# Patient Record
Sex: Male | Born: 1950
Health system: Southern US, Community
[De-identification: ages and names within clinical notes are randomized; demographics above are authoritative.]

## PROBLEM LIST (undated history)

## (undated) DIAGNOSIS — N200 Calculus of kidney: Secondary | ICD-10-CM

## (undated) DIAGNOSIS — I2699 Other pulmonary embolism without acute cor pulmonale: Secondary | ICD-10-CM

## (undated) DIAGNOSIS — I10 Essential (primary) hypertension: Secondary | ICD-10-CM

## (undated) DIAGNOSIS — I712 Thoracic aortic aneurysm, without rupture: Secondary | ICD-10-CM

## (undated) DIAGNOSIS — M109 Gout, unspecified: Secondary | ICD-10-CM

## (undated) DIAGNOSIS — R002 Palpitations: Secondary | ICD-10-CM

## (undated) DIAGNOSIS — R0600 Dyspnea, unspecified: Secondary | ICD-10-CM

## (undated) DIAGNOSIS — R7989 Other specified abnormal findings of blood chemistry: Secondary | ICD-10-CM

## (undated) DIAGNOSIS — E7800 Pure hypercholesterolemia, unspecified: Secondary | ICD-10-CM

## (undated) HISTORY — DX: Dyspnea, unspecified: R06.00

## (undated) HISTORY — PX: OTHER SURGICAL HISTORY: SHX169

## (undated) HISTORY — DX: Other specified abnormal findings of blood chemistry: R79.89

## (undated) HISTORY — DX: Thoracic aortic aneurysm, without rupture: I71.2

## (undated) HISTORY — DX: Other pulmonary embolism without acute cor pulmonale: I26.99

## (undated) HISTORY — DX: Palpitations: R00.2

---

## 2001-04-23 ENCOUNTER — Ambulatory Visit (HOSPITAL_COMMUNITY): Admission: RE | Admit: 2001-04-23 | Discharge: 2001-04-23 | Payer: Self-pay | Admitting: Ophthalmology

## 2001-04-23 ENCOUNTER — Encounter: Payer: Self-pay | Admitting: Ophthalmology

## 2006-01-16 ENCOUNTER — Emergency Department (HOSPITAL_COMMUNITY): Admission: EM | Admit: 2006-01-16 | Discharge: 2006-01-16 | Payer: Self-pay | Admitting: Emergency Medicine

## 2006-01-19 ENCOUNTER — Encounter (HOSPITAL_COMMUNITY): Admission: RE | Admit: 2006-01-19 | Discharge: 2006-03-23 | Payer: Self-pay | Admitting: Emergency Medicine

## 2009-10-28 ENCOUNTER — Emergency Department (HOSPITAL_COMMUNITY): Admission: EM | Admit: 2009-10-28 | Discharge: 2009-10-28 | Payer: Self-pay | Admitting: Emergency Medicine

## 2010-09-28 LAB — DIFFERENTIAL
Eosinophils Absolute: 0.1 10*3/uL (ref 0.0–0.7)
Lymphs Abs: 2.2 10*3/uL (ref 0.7–4.0)
Monocytes Absolute: 1.7 10*3/uL — ABNORMAL HIGH (ref 0.1–1.0)
Neutrophils Relative %: 63 % (ref 43–77)

## 2010-09-28 LAB — CBC
HCT: 40.8 % (ref 39.0–52.0)
Hemoglobin: 13.9 g/dL (ref 13.0–17.0)
MCHC: 34 g/dL (ref 30.0–36.0)
MCV: 84.7 fL (ref 78.0–100.0)
Platelets: 233 10*3/uL (ref 150–400)
RBC: 4.81 MIL/uL (ref 4.22–5.81)
RDW: 12.8 % (ref 11.5–15.5)
WBC: 10.9 10*3/uL — ABNORMAL HIGH (ref 4.0–10.5)

## 2010-09-28 LAB — URINALYSIS, ROUTINE W REFLEX MICROSCOPIC
Bilirubin Urine: NEGATIVE
Glucose, UA: NEGATIVE mg/dL
Ketones, ur: 15 mg/dL — AB
Nitrite: NEGATIVE
Protein, ur: 30 mg/dL — AB
Specific Gravity, Urine: 1.024 (ref 1.005–1.030)
Urobilinogen, UA: 0.2 mg/dL (ref 0.0–1.0)
pH: 5.5 (ref 5.0–8.0)

## 2010-09-28 LAB — POCT I-STAT, CHEM 8
Glucose, Bld: 123 mg/dL — ABNORMAL HIGH (ref 70–99)
HCT: 42 % (ref 39.0–52.0)
Hemoglobin: 14.3 g/dL (ref 13.0–17.0)
Potassium: 3.6 mEq/L (ref 3.5–5.1)
Sodium: 137 mEq/L (ref 135–145)
TCO2: 24 mmol/L (ref 0–100)

## 2010-09-28 LAB — URINE MICROSCOPIC-ADD ON

## 2015-05-18 ENCOUNTER — Ambulatory Visit
Admission: RE | Admit: 2015-05-18 | Discharge: 2015-05-18 | Disposition: A | Discharge status: Home / Self Care | Payer: 59 | Source: Ambulatory Visit | Attending: Internal Medicine | Admitting: Internal Medicine

## 2015-05-18 ENCOUNTER — Other Ambulatory Visit: Payer: Self-pay | Admitting: Internal Medicine

## 2015-05-18 DIAGNOSIS — N451 Epididymitis: Secondary | ICD-10-CM

## 2015-05-18 DIAGNOSIS — N5082 Scrotal pain: Secondary | ICD-10-CM

## 2018-01-15 ENCOUNTER — Ambulatory Visit (INDEPENDENT_AMBULATORY_CARE_PROVIDER_SITE_OTHER): Payer: Medicare Other | Admitting: Orthopedic Surgery

## 2018-01-15 ENCOUNTER — Ambulatory Visit (INDEPENDENT_AMBULATORY_CARE_PROVIDER_SITE_OTHER): Payer: Medicare Other

## 2018-01-15 ENCOUNTER — Encounter (INDEPENDENT_AMBULATORY_CARE_PROVIDER_SITE_OTHER): Payer: Self-pay | Admitting: Orthopedic Surgery

## 2018-01-15 VITALS — Ht 69.0 in | Wt 255.0 lb

## 2018-01-15 DIAGNOSIS — M25562 Pain in left knee: Secondary | ICD-10-CM | POA: Diagnosis not present

## 2018-01-15 NOTE — Progress Notes (Signed)
Office Visit Note   Patient: Kenneth [name]           Date of Birth: 04-Nov-1950           MRN: [id number] Visit Date: 01/15/2018 Requested by: [name], MD [id number] [place], [place] [address] PCP: [name], MD  Subjective: Chief Complaint  Patient presents with  . Left Knee - Pain    HPI: Kenneth [name] is a 67 year old patient with left knee pain.  He is had pain in the left knee for 6 years.  He is unable to walk on a hard surface.  He cannot really do this for any length of time.  He wants to discuss options.  He describes a history of gout but his uric acid level is controlled on allopurinol.  Does describe weakness and giving way along with bilateral ankle pain.  He does have a history of hypertension but no diabetes.  BMI 37.7.  He does not work except doing yard work.  No history of trauma to the knee or ankles.  He also takes ibuprofen as needed.              ROS: All systems reviewed are negative as they relate to the chief complaint within the history of present illness.  Patient denies  fevers or chills.  Impression is bilateral knee arthritis much worse on the left than the right.  He has bumped bone-on-bone medial compartment arthritis in that left knee. Assessment & Plan: Visit Diagnoses:  1. Left knee pain, unspecified chronicity     Plan: Both ankles also have some swelling more on the left than the right.  I think this is more than likely related to his gout.  No real intervention required in the ankles at this time but the knee may require injection at times.  He wants to try weight loss first.  Also encouraged him to use stationary bike as a tool for leg strengthening without putting a lot of wear and tear on the knee.  When his knee acts up and flares up again he will come back in for an injection of cortisone.  Until then we will see him back as needed  Follow-Up Instructions: Return if symptoms worsen or fail to improve.   Orders:  Orders Placed This  Encounter  Procedures  . XR KNEE 3 VIEW LEFT   No orders of the defined types were placed in this encounter.     Procedures: No procedures performed   Clinical Data: No additional findings.  Objective: Vital Signs: Ht 5\' 9"  (1.753 m)   Wt 255 lb (115.7 kg)   BMI 37.66 kg/m   Physical Exam:   Constitutional: Patient appears well-developed HEENT:  Head: Normocephalic Eyes:EOM are normal Neck: Normal range of motion Cardiovascular: Normal rate Pulmonary/chest: Effort normal Neurologic: Patient is alert Skin: Skin is warm Psychiatric: Patient has normal mood and affect  Orthopedic exam demonstrates normal gait alignment.  Does have a little bit more varus on that left than the right knee.  Pedal pulses palpable.  Ortho Exam: Range of motion includes full extension bilaterally to about 120 of flexion bilaterally.  On the left knee collateral and cruciate ligaments are stable.  There is no effusion in either knee.  Extensor mechanism is intact bilaterally.  No groin pain with internal extra rotation of the leg.  Bilateral ankle exam demonstrates palpable intact nontender anterior to posterior to peroneal and Achilles tendons.  He has good symmetric range of motion  but some boggy synovitis in that left ankle compared to the right.  No tissue crepitus is present.  No restriction of ankle dorsiflexion plantarflexion inversion or eversion or subtalar motion bilaterally.  Specialty Comments:  No specialty comments available.  Imaging: Xr Knee 3 View Left  Result Date: 01/15/2018 AP lateral merchant left knee reviewed.  Severe medial compartment arthritis is present with bone-on-bone changes noted.  Lateral compartment arthritis also present but less severe.  Patellofemoral spurring is present with central location of the patella within the trochlear groove.  No fracture or dislocation is seen.  Varus alignment is present more on the left than the right    PMFS History: There  are no active problems to display for this patient.  History reviewed. No pertinent past medical history.  History reviewed. No pertinent family history.  History reviewed. No pertinent surgical history. Social History   Occupational History  . Not on file  Tobacco Use  . Smoking status: Not on file  Substance and Sexual Activity  . Alcohol use: Not on file  . Drug use: Not on file  . Sexual activity: Not on file

## 2018-09-03 ENCOUNTER — Other Ambulatory Visit: Payer: Self-pay

## 2018-09-03 ENCOUNTER — Observation Stay (HOSPITAL_COMMUNITY)
Admission: EM | Admit: 2018-09-03 | Discharge: 2018-09-05 | Disposition: A | Discharge status: Home / Self Care | Payer: Medicare Other | Attending: Internal Medicine | Admitting: Internal Medicine

## 2018-09-03 ENCOUNTER — Emergency Department (HOSPITAL_COMMUNITY): Payer: Medicare Other

## 2018-09-03 ENCOUNTER — Other Ambulatory Visit: Payer: Self-pay | Admitting: Internal Medicine

## 2018-09-03 ENCOUNTER — Ambulatory Visit (HOSPITAL_COMMUNITY)
Admission: EM | Admit: 2018-09-03 | Discharge: 2018-09-03 | Disposition: A | Discharge status: Home / Self Care | Payer: Self-pay

## 2018-09-03 ENCOUNTER — Encounter (HOSPITAL_COMMUNITY): Payer: Self-pay | Admitting: *Deleted

## 2018-09-03 DIAGNOSIS — I2699 Other pulmonary embolism without acute cor pulmonale: Secondary | ICD-10-CM | POA: Diagnosis not present

## 2018-09-03 DIAGNOSIS — Z951 Presence of aortocoronary bypass graft: Secondary | ICD-10-CM | POA: Diagnosis not present

## 2018-09-03 DIAGNOSIS — R778 Other specified abnormalities of plasma proteins: Secondary | ICD-10-CM

## 2018-09-03 DIAGNOSIS — Z6839 Body mass index (BMI) 39.0-39.9, adult: Secondary | ICD-10-CM | POA: Insufficient documentation

## 2018-09-03 DIAGNOSIS — I160 Hypertensive urgency: Secondary | ICD-10-CM | POA: Diagnosis present

## 2018-09-03 DIAGNOSIS — I2510 Atherosclerotic heart disease of native coronary artery without angina pectoris: Secondary | ICD-10-CM | POA: Insufficient documentation

## 2018-09-03 DIAGNOSIS — Z79899 Other long term (current) drug therapy: Secondary | ICD-10-CM | POA: Diagnosis not present

## 2018-09-03 DIAGNOSIS — I499 Cardiac arrhythmia, unspecified: Secondary | ICD-10-CM | POA: Insufficient documentation

## 2018-09-03 DIAGNOSIS — E785 Hyperlipidemia, unspecified: Secondary | ICD-10-CM | POA: Diagnosis not present

## 2018-09-03 DIAGNOSIS — Z85820 Personal history of malignant melanoma of skin: Secondary | ICD-10-CM | POA: Diagnosis not present

## 2018-09-03 DIAGNOSIS — R0600 Dyspnea, unspecified: Principal | ICD-10-CM | POA: Insufficient documentation

## 2018-09-03 DIAGNOSIS — I7121 Aneurysm of the ascending aorta, without rupture: Secondary | ICD-10-CM | POA: Diagnosis present

## 2018-09-03 DIAGNOSIS — R002 Palpitations: Secondary | ICD-10-CM

## 2018-09-03 DIAGNOSIS — M109 Gout, unspecified: Secondary | ICD-10-CM | POA: Insufficient documentation

## 2018-09-03 DIAGNOSIS — E669 Obesity, unspecified: Secondary | ICD-10-CM | POA: Diagnosis not present

## 2018-09-03 DIAGNOSIS — Z8249 Family history of ischemic heart disease and other diseases of the circulatory system: Secondary | ICD-10-CM | POA: Diagnosis not present

## 2018-09-03 DIAGNOSIS — E7800 Pure hypercholesterolemia, unspecified: Secondary | ICD-10-CM | POA: Diagnosis not present

## 2018-09-03 DIAGNOSIS — Z791 Long term (current) use of non-steroidal anti-inflammatories (NSAID): Secondary | ICD-10-CM | POA: Insufficient documentation

## 2018-09-03 DIAGNOSIS — R7989 Other specified abnormal findings of blood chemistry: Secondary | ICD-10-CM | POA: Insufficient documentation

## 2018-09-03 DIAGNOSIS — I10 Essential (primary) hypertension: Secondary | ICD-10-CM | POA: Diagnosis not present

## 2018-09-03 DIAGNOSIS — R011 Cardiac murmur, unspecified: Secondary | ICD-10-CM

## 2018-09-03 DIAGNOSIS — I712 Thoracic aortic aneurysm, without rupture: Secondary | ICD-10-CM | POA: Diagnosis not present

## 2018-09-03 HISTORY — DX: Gout, unspecified: M10.9

## 2018-09-03 HISTORY — DX: Essential (primary) hypertension: I10

## 2018-09-03 HISTORY — DX: Other specified abnormalities of plasma proteins: R77.8

## 2018-09-03 HISTORY — DX: Other pulmonary embolism without acute cor pulmonale: I26.99

## 2018-09-03 HISTORY — DX: Calculus of kidney: N20.0

## 2018-09-03 HISTORY — DX: Pure hypercholesterolemia, unspecified: E78.00

## 2018-09-03 HISTORY — DX: Dyspnea, unspecified: R06.00

## 2018-09-03 HISTORY — DX: Other specified abnormal findings of blood chemistry: R79.89

## 2018-09-03 LAB — CBC WITH DIFFERENTIAL/PLATELET
ABS IMMATURE GRANULOCYTES: 0.04 10*3/uL (ref 0.00–0.07)
BASOS PCT: 1 %
Basophils Absolute: 0.1 10*3/uL (ref 0.0–0.1)
Eosinophils Absolute: 0.1 10*3/uL (ref 0.0–0.5)
Eosinophils Relative: 1 %
HCT: 43.3 % (ref 39.0–52.0)
Hemoglobin: 13.9 g/dL (ref 13.0–17.0)
Immature Granulocytes: 1 %
Lymphocytes Relative: 20 %
Lymphs Abs: 1.7 10*3/uL (ref 0.7–4.0)
MCH: 27 pg (ref 26.0–34.0)
MCHC: 32.1 g/dL (ref 30.0–36.0)
MCV: 84.2 fL (ref 80.0–100.0)
MONO ABS: 1.1 10*3/uL — AB (ref 0.1–1.0)
Monocytes Relative: 13 %
NEUTROS ABS: 5.4 10*3/uL (ref 1.7–7.7)
NEUTROS PCT: 64 %
NRBC: 0 % (ref 0.0–0.2)
PLATELETS: 220 10*3/uL (ref 150–400)
RBC: 5.14 MIL/uL (ref 4.22–5.81)
RDW: 12.7 % (ref 11.5–15.5)
WBC: 8.3 10*3/uL (ref 4.0–10.5)

## 2018-09-03 LAB — I-STAT TROPONIN, ED: Troponin i, poc: 0.09 ng/mL (ref 0.00–0.08)

## 2018-09-03 LAB — COMPREHENSIVE METABOLIC PANEL
ALT: 30 U/L (ref 0–44)
AST: 35 U/L (ref 15–41)
Albumin: 4.1 g/dL (ref 3.5–5.0)
Alkaline Phosphatase: 49 U/L (ref 38–126)
Anion gap: 10 (ref 5–15)
BUN: 10 mg/dL (ref 8–23)
CALCIUM: 9.1 mg/dL (ref 8.9–10.3)
CHLORIDE: 104 mmol/L (ref 98–111)
CO2: 24 mmol/L (ref 22–32)
CREATININE: 0.98 mg/dL (ref 0.61–1.24)
GFR calc non Af Amer: >60 mL/min (ref >60)
GLUCOSE: 97 mg/dL (ref 70–99)
Potassium: 3.9 mmol/L (ref 3.5–5.1)
SODIUM: 138 mmol/L (ref 135–145)
Total Bilirubin: 0.7 mg/dL (ref 0.3–1.2)
Total Protein: 7.1 g/dL (ref 6.5–8.1)

## 2018-09-03 LAB — URINALYSIS, ROUTINE W REFLEX MICROSCOPIC
BACTERIA UA: NONE SEEN
Bilirubin Urine: NEGATIVE
Glucose, UA: NEGATIVE mg/dL
Ketones, ur: NEGATIVE mg/dL
Leukocytes,Ua: NEGATIVE
Nitrite: NEGATIVE
Protein, ur: NEGATIVE mg/dL
Specific Gravity, Urine: 1.004 — ABNORMAL LOW (ref 1.005–1.030)
pH: 8 (ref 5.0–8.0)

## 2018-09-03 LAB — TROPONIN I
TROPONIN I: 0.12 ng/mL — AB (ref <0.03)
Troponin I: 0.12 ng/mL (ref <0.03)
Troponin I: 0.11 ng/mL (ref <0.03)

## 2018-09-03 LAB — D-DIMER, QUANTITATIVE: D-Dimer, Quant: 6.59 ug/mL-FEU — ABNORMAL HIGH (ref 0.00–0.50)

## 2018-09-03 LAB — BRAIN NATRIURETIC PEPTIDE: B Natriuretic Peptide: 119.4 pg/mL — ABNORMAL HIGH (ref 0.0–100.0)

## 2018-09-03 MED ORDER — ONDANSETRON HCL 4 MG PO TABS: 4.0000 mg | ORAL_TABLET | Freq: Four times a day (QID) | ORAL | Status: DC | PRN

## 2018-09-03 MED ORDER — FINASTERIDE 5 MG PO TABS
5.0000 mg | ORAL_TABLET | Freq: Every day | ORAL | Status: DC
  Administered 2018-09-04 – 2018-09-05 (×2): 5 mg via ORAL
  Filled 2018-09-03 (×2): qty 1

## 2018-09-03 MED ORDER — PRAVASTATIN SODIUM 10 MG PO TABS: 20.0000 mg | ORAL_TABLET | Freq: Every day | ORAL | Status: DC

## 2018-09-03 MED ORDER — LOSARTAN POTASSIUM 50 MG PO TABS
100.0000 mg | ORAL_TABLET | Freq: Every day | ORAL | Status: DC
  Administered 2018-09-04 – 2018-09-05 (×2): 100 mg via ORAL
  Filled 2018-09-03 (×2): qty 2

## 2018-09-03 MED ORDER — ALLOPURINOL 100 MG PO TABS
200.0000 mg | ORAL_TABLET | Freq: Every day | ORAL | Status: DC
  Administered 2018-09-04 – 2018-09-05 (×2): 200 mg via ORAL
  Filled 2018-09-03 (×2): qty 2

## 2018-09-03 MED ORDER — SODIUM CHLORIDE 0.9 % IV SOLN
INTRAVENOUS | Status: AC
  Administered 2018-09-03: 22:00:00 via INTRAVENOUS

## 2018-09-03 MED ORDER — HYDROCODONE-ACETAMINOPHEN 5-325 MG PO TABS: 1.0000 | ORAL_TABLET | ORAL | Status: DC | PRN

## 2018-09-03 MED ORDER — HYDRALAZINE HCL 20 MG/ML IJ SOLN: 10.0000 mg | Freq: Once | INTRAMUSCULAR | Status: DC

## 2018-09-03 MED ORDER — ACETAMINOPHEN 325 MG PO TABS: 650.0000 mg | ORAL_TABLET | Freq: Four times a day (QID) | ORAL | Status: DC | PRN

## 2018-09-03 MED ORDER — ENOXAPARIN SODIUM 40 MG/0.4ML ~~LOC~~ SOLN
40.0000 mg | SUBCUTANEOUS | Status: DC
  Administered 2018-09-03: 40 mg via SUBCUTANEOUS
  Filled 2018-09-03: qty 0.4

## 2018-09-03 MED ORDER — ALBUTEROL SULFATE (2.5 MG/3ML) 0.083% IN NEBU: 5.0000 mg | INHALATION_SOLUTION | Freq: Once | RESPIRATORY_TRACT | Status: DC

## 2018-09-03 MED ORDER — VERAPAMIL HCL 80 MG PO TABS
160.0000 mg | ORAL_TABLET | Freq: Every day | ORAL | Status: DC
  Administered 2018-09-04: 160 mg via ORAL
  Filled 2018-09-03: qty 2

## 2018-09-03 MED ORDER — ASPIRIN 325 MG PO TABS
325.0000 mg | ORAL_TABLET | Freq: Once | ORAL | Status: AC
Start: 2018-09-03 — End: 2018-09-03
  Administered 2018-09-03: 325 mg via ORAL
  Filled 2018-09-03: qty 1

## 2018-09-03 MED ORDER — CLONIDINE HCL 0.3 MG PO TABS
0.3000 mg | ORAL_TABLET | Freq: Every day | ORAL | Status: DC
  Administered 2018-09-04: 0.3 mg via ORAL
  Filled 2018-09-03: qty 1

## 2018-09-03 MED ORDER — ONDANSETRON HCL 4 MG/2ML IJ SOLN: 4.0000 mg | Freq: Four times a day (QID) | INTRAMUSCULAR | Status: DC | PRN

## 2018-09-03 MED ORDER — ACETAMINOPHEN 650 MG RE SUPP: 650.0000 mg | Freq: Four times a day (QID) | RECTAL | Status: DC | PRN

## 2018-09-03 MED ORDER — CLONIDINE HCL 0.1 MG PO TABS
0.1000 mg | ORAL_TABLET | Freq: Once | ORAL | Status: AC
  Administered 2018-09-03: 0.1 mg via ORAL
  Filled 2018-09-03: qty 1

## 2018-09-03 NOTE — ED Notes (Signed)
RN informed that it was ok for visitor to come back

## 2018-09-03 NOTE — ED Notes (Signed)
Attempted report x1. 

## 2018-09-03 NOTE — H&P (Addendum)
[name] IPJ:[id number] DOB: September 06, 1950 DOA: 09/03/2018     PCP: Kenneth Panda, MD   Outpatient Specialists: NONE   Patient arrived to ER on 09/03/18 at 1347  Patient coming from: home Lives alone,     Chief Complaint:  Chief Complaint  Patient presents with  . Shortness of Breath    HPI: Kenneth [name] is a 68 y.o. male with medical history significant of history of irregular heart beat, HTN and HLD, GOUT, melanoma 20 y ago    Presented with less of breath with exertion for past 4 days somewhat better with rest lower extremity swelling, he has hard time walking even 50 ft with out getting very short of breath. No pain with deep breathing Last time seen by cardiology yesterday he has been having some twinges in his left chest his family is concerned because of a similar to prior episode prior to having a CABG.  Has been feeling weak overall together. Been having chest pressure-like for the past 2 weeks. He was referred to go get an echogram as an outpatient but did not get it yet.  For the past few days he has been having much more shortness of breath with exertion.  No associated fevers or chills or cough no recent travel  Regarding pertinent Chronic problems:   Hypertension on losartan and the clonidine verapamil Reports occasional palpitation had an episode 2 wks ago when he had palpitations for a whole week was scheduled to had echo done  While in ER: To be hypertensive with blood pressures in 190s felt to be likely ACS versus hypertensive urgency his troponin was noted to be elevated  cardiology was consulted this was most likely secondary to demand ischemia recommend no heparin control blood pressure and admit to hospitalist  The following Work up has been ordered so far:  Orders Placed This Encounter  Procedures  . DG Chest 2 View  . CBC with Differential/Platelet  . Comprehensive metabolic panel  . Brain natriuretic peptide  . Troponin I - Add-On to  previous collection  . If O2 Sat <94% administer O2 at 2 liters/minute via nasal cannula  . Inpatient consult to Cardiology  . Consult to hospitalist  . Pulse oximetry, continuous  . I-stat troponin, ED  . EKG 12-Lead  . ED EKG  . Saline lock IV    Following Medications were ordered in ER: Medications  cloNIDine (CATAPRES) tablet 0.1 mg (0.1 mg Oral Given 09/03/18 1746)  aspirin tablet 325 mg (325 mg Oral Given 09/03/18 1746)    Significant initial  Findings: Abnormal Labs Reviewed  CBC WITH DIFFERENTIAL/PLATELET - Abnormal; Notable for the following components:      Result Value   Monocytes Absolute 1.1 (*)    All other components within normal limits  BRAIN NATRIURETIC PEPTIDE - Abnormal; Notable for the following components:   B Natriuretic Peptide 119.4 (*)    All other components within normal limits  TROPONIN I - Abnormal; Notable for the following components:   Troponin I 0.12 (*)    All other components within normal limits  I-STAT TROPONIN, ED - Abnormal; Notable for the following components:   Troponin i, poc 0.09 (*)    All other components within normal limits     Lactic Acid, Venous No results found for: LATICACIDVEN  Na 138 K 3.9  Cr   stable,   Lab Results  Component Value Date   CREATININE 0.98 09/03/2018   CREATININE 1.5 10/28/2009  WBC  8.3   HG/HCT  Stable     Component Value Date/Time   HGB 13.9 09/03/2018 1641   HCT 43.3 09/03/2018 1641     Troponin (Point of Care Test) Recent Labs    09/03/18 1652  TROPIPOC 0.09*     BNP (last 3 results) Recent Labs    09/03/18 1641  BNP 119.4*    ProBNP (last 3 results) No results for input(s): PROBNP in the last 8760 hours.    UA   ordered   CXR - NON acute   ECG:  Personally reviewed by me showing: HR : 82 Rhythm:  NSR,   no evidence of ischemic changes QTC 436      ED Triage Vitals  Enc Vitals Group     BP 09/03/18 1354 (!) 168/84     Pulse Rate 09/03/18 1354 85      Resp 09/03/18 1354 17     Temp 09/03/18 1354 98.3 F (36.8 C)     Temp Source 09/03/18 1354 Oral     SpO2 09/03/18 1354 98 %     Weight 09/03/18 1641 265 lb (120.2 kg)     Height 09/03/18 1641 5\' 9"  (1.753 m)     Head Circumference --      Peak Flow --      Pain Score 09/03/18 1643 0     Pain Loc --      Pain Edu? --      Excl. in [place]? --   TMAX(24)@       Latest  Blood pressure (!) 169/88, pulse 70, temperature 98.3 F (36.8 C), temperature source Oral, resp. rate 16, height 5\' 9"  (1.753 m), weight 120.2 kg, SpO2 98 %.    ER Provider Called:  Cardiology    Dr.Turner They Recommend admit to medicine  Re consult if abnormal work up   Hospitalist was called for admission for hypertensive urgency   Review of Systems:    Pertinent positives include: shortness of breath at rest. dyspnea on exertion  Constitutional:  No weight loss, night sweats, Fevers, chills, fatigue, weight loss  HEENT:  No headaches, Difficulty swallowing,Tooth/dental problems,Sore throat,  No sneezing, itching, ear ache, nasal congestion, post nasal drip,  Cardio-vascular:  No chest pain, Orthopnea, PND, anasarca, dizziness, palpitations.no Bilateral lower extremity swelling  GI:  No heartburn, indigestion, abdominal pain, nausea, vomiting, diarrhea, change in bowel habits, loss of appetite, melena, blood in stool, hematemesis Resp:  no , No excess mucus, no productive cough, No non-productive cough, No coughing up of blood.No change in color of mucus.No wheezing. Skin:  no rash or lesions. No jaundice GU:  no dysuria, change in color of urine, no urgency or frequency. No straining to urinate.  No flank pain.  Musculoskeletal:  No joint pain or no joint swelling. No decreased range of motion. No back pain.  Psych:  No change in mood or affect. No depression or anxiety. No memory loss.  Neuro: no localizing neurological complaints, no tingling, no weakness, no double vision, no gait abnormality, no  slurred speech, no confusion  All systems reviewed and apart from [place] all are negative  Past Medical History:   Past Medical History:  Diagnosis Date  . Gout   . Hypercholesteremia   . Hypertension       History reviewed. No pertinent surgical history.  Social History:  Ambulatory  Independently      reports that he has never smoked. He has never used smokeless tobacco. He  reports that he does not drink alcohol or use drugs.     Family History:   Family History  Problem Relation Age of Onset  . Diabetes Mother   . Hypertension Mother   . CAD Brother     Allergies: No Known Allergies   Prior to Admission medications   Medication Sig Start Date End Date Taking? Authorizing Provider  allopurinol (ZYLOPRIM) 100 MG tablet Take 200 mg by mouth daily.  10/27/17  Yes [provider]  calcium carbonate (TUMS - DOSED IN MG ELEMENTAL CALCIUM) 500 MG chewable tablet Chew 1 tablet by mouth at bedtime as needed for indigestion or heartburn.   Yes [provider]  cloNIDine (CATAPRES) 0.3 MG tablet Take 0.3 mg by mouth daily. 08/11/18  Yes [provider]  finasteride (PROSCAR) 5 MG tablet Take 5 mg by mouth daily. 11/30/17  Yes [provider]  ibuprofen (ADVIL,MOTRIN) 200 MG tablet Take 200 mg by mouth every 6 (six) hours as needed for moderate pain.   Yes [provider]  losartan (COZAAR) 100 MG tablet Take 100 mg by mouth daily. 12/20/17  Yes [provider]  lovastatin (MEVACOR) 20 MG tablet Take 20 mg by mouth daily. 12/26/17  Yes [provider]  verapamil (CALAN) 80 MG tablet Take 160 mg by mouth daily. 11/30/17  Yes [provider]   Physical Exam: Blood pressure (!) 169/88, pulse 70, temperature 98.3 F (36.8 C), temperature source Oral, resp. rate 16, height 5\' 9"  (1.753 m), weight 120.2 kg, SpO2 98 %. 1. General:  in No Acute distress   well -appearing 2. Psychological: Alert and  Oriented 3.  Head/ENT:    Dry Mucous Membranes                          Head Non traumatic, neck supple                           Poor Dentition 4. SKIN:   decreased Skin turgor,  Skin clean Dry and intact no rash 5. Heart: Regular rate and rhythm no  Murmur, no Rub or gallop 6. Lungs:  Clear to auscultation bilaterally, no wheezes or crackles   7. Abdomen: Soft,   Non distended  Obese bowel sounds present 8. Lower extremities: no clubbing, cyanosis, trace  edema 9. Neurologically Grossly intact, moving all 4 extremities equally  10. MSK: Normal range of motion   LABS:     Recent Labs  Lab 09/03/18 1641  WBC 8.3  NEUTROABS 5.4  HGB 13.9  HCT 43.3  MCV 84.2  PLT 962   Basic Metabolic Panel: Recent Labs  Lab 09/03/18 1641  NA 138  K 3.9  CL 104  CO2 24  GLUCOSE 97  BUN 10  CREATININE 0.98  CALCIUM 9.1      Recent Labs  Lab 09/03/18 1641  AST 35  ALT 30  ALKPHOS 49  BILITOT 0.7  PROT 7.1  ALBUMIN 4.1   No results for input(s): LIPASE, AMYLASE in the last 168 hours. No results for input(s): AMMONIA in the last 168 hours.    HbA1C: No results for input(s): HGBA1C in the last 72 hours. CBG: No results for input(s): GLUCAP in the last 168 hours.    Urine analysis:    Component Value Date/Time   COLORURINE YELLOW 10/28/2009 0511   APPEARANCEUR CLOUDY (A) 10/28/2009 0511   LABSPEC 1.024 10/28/2009 8366  PHURINE 5.5 10/28/2009 0511   GLUCOSEU NEGATIVE 10/28/2009 0511   HGBUR LARGE (A) 10/28/2009 0511   BILIRUBINUR NEGATIVE 10/28/2009 0511   KETONESUR 15 (A) 10/28/2009 0511   PROTEINUR 30 (A) 10/28/2009 0511   UROBILINOGEN 0.2 10/28/2009 0511   NITRITE NEGATIVE 10/28/2009 0511   LEUKOCYTESUR MODERATE (A) 10/28/2009 0511    Cultures: No results found for: SDES, SPECREQUEST, CULT, REPTSTATUS   Radiological Exams on Admission: Dg Chest 2 View  Result Date: 09/03/2018 CLINICAL DATA:  Shortness of breath 2 weeks with minimal exertion. Mild chest pain. EXAM: CHEST  - 2 VIEW COMPARISON:  None. FINDINGS: Lungs are adequately inflated without consolidation or effusion. Cardiomediastinal silhouette is within normal. There are mild degenerative changes of the spine. IMPRESSION: No active cardiopulmonary disease. Electronically Signed   By: Marin Olp M.D.   On: 09/03/2018 15:32    Chart has been reviewed    Assessment/Plan  68 y.o. male with medical history significant of history of irregular heart beat, HTN and HLD, GOUT, melanoma 20 y ago     Admitted for dyspnea found to have PE  Present on Admission: . Hypertensive urgency - BP currently stable restart home medications if no evidence of  severe PE . Elevated troponin -in the setting of dyspnea severe.  Cardiac etiology is a possibility cardiology is been consulted thought it may be secondary to hypertensive urgency given somewhat elevated blood pressures.  We will continue to cycle cardiac enzymes given recurrent history of palpitations sudden onset of dyspnea to severity where he could not ambulate without severe shortness of breath will need further work-up. Given elevated d-dimer will check CTA chest to rule out PE also be helpful to evaluate for severe cardiac calcification   . Dyspnea -check for PE.  Obtain echogram.  Patient with remote history of melanoma and positive d-dimer Palpitations intermittent will monitor on telemetry given episodes every few weeks or so would probably benefit from further cardiac work-up and event monitor as an outpatient  Pulmonary embolism CT showed PE.  Will order Dopplers lower extremity echogram and initiate heparin.  Elevated troponin and BNP somewhat worrisome but no evidence of right heart strain on CT and no evidence of severe large blood clot burden.  Discussed at length with patient.  Patient states he is very sedentary in the winter but has history of melanoma in the past and would benefit from further malignancy work-up  Aortic aneurysm-will need better  blood pressure control at the time of discharge once stable will need to have regular follow-up with vascular surgeon repeat imaging to monitor  Coronary artery calcification -with family history of significant coronary artery disease in his brother patient is still as high possibility of concomitant coronary artery disease appreciate cardiology input pain echogram to evaluate for wall motion abnormality  Other plan as per orders.  DVT prophylaxis:    Lovenox     Code Status:  FULL CODE  as per patient  I had personally discussed CODE STATUS with patient    Family Communication:   Family not  at  Bedside  plan of care was discussed with   Brother     Disposition Plan:   To home once workup is complete and patient is stable                      Consults called:  email cardiology    Admission status:    Obs    Level of care  tele  For 12H        Adelise Buswell 09/03/2018, 8:27 PM    [facility]     after 2 AM please page floor coverage PA If 7AM-7PM, please contact the day team taking care of the patient using [email or web address]

## 2018-09-03 NOTE — ED Notes (Signed)
Patient reports feeling weak.  Denies any chest pain.  Patient says he has had an irregular heart rhythm, and difficulty catching his breath.  Patient is alert and oriented x 3 , appears anxious.  Speaking in complete sentences. Patient's brother is with patient and frequently mentions he has had bypass surgery and concerned patient is exhibiting similar symptoms.  Patient has seen pcp last week for the same and reports normal blood work and ekg, no further follow up. Left radial pulse is regular and apical pulse is regular, A=R.  Breath sounds decreased, but no wheezing.    Spoke to dr Meda Coffee about patient.  Patient and brother are agreeable to going to ED now.

## 2018-09-03 NOTE — ED Triage Notes (Addendum)
Pt endorses sob with exertion x 4 days, alleviated with rest.  Reports bila LE edema which is not new.  Hx of afib, he went to see his cardiologist yesterday.  Rhythm was NSR.  He also reports feeling flushed, pt's face appears red.  Pt reports dizziness and mild "twinge" in his L chest to mid-chest.  He states the "twinge" in his chest only happened x 1 last week. He denies any at this time.,  He is A&Ox 4, in NAD.

## 2018-09-03 NOTE — ED Notes (Signed)
Dr. Darl Householder notified of elevated I-stat trop of 0.09

## 2018-09-03 NOTE — ED Provider Notes (Signed)
[place] EMERGENCY DEPARTMENT Provider Note   CSN: [id number] Arrival date & time: 09/03/18  1347    History   Chief Complaint Chief Complaint  Patient presents with  . Shortness of Breath    HPI Kenneth [name] is a 68 y.o. male hx of gout, HL, HTN, here presenting with shortness of breath, chest pressure.  Patient states that he has intermittent chest pressure over the last 2 weeks or so.  Patient states that the shortness of breath is worse with exertion.  He also has progressive bilateral leg swelling as well.  Patient saw his primary care doctor a week ago and had a annual physical with normal CBC, CMP.  Patient is referred to get an echo outpatient to further evaluate but did not get it yet.  Patient states that over the last several days he has much worse shortness of breath even with minimal exertion.  He denies any fevers or chills or cough.  Denies any recent travel or history of blood clots.  His brother has cardiac disease but denies any personal history of CAD or stents.  Patient states that he never had a stress test or echo.     The history is provided by the patient.    Past Medical History:  Diagnosis Date  . Gout   . Hypercholesteremia   . Hypertension     There are no active problems to display for this patient.   History reviewed. No pertinent surgical history.      Home Medications    Prior to Admission medications   Medication Sig Start Date End Date Taking? Authorizing Provider  allopurinol (ZYLOPRIM) 100 MG tablet  10/27/17   [provider]  cloNIDine (CATAPRES) 0.1 MG tablet TAKE 2 TABLETS BY MOUTH ONCE DAILY AT BEDTIME 11/30/17   [provider]  finasteride (PROSCAR) 5 MG tablet Take 5 mg by mouth daily. 11/30/17   [provider]  losartan (COZAAR) 100 MG tablet Take 100 mg by mouth daily. 12/20/17   [provider]  lovastatin (MEVACOR) 20 MG tablet Take 20 mg by mouth daily. 12/26/17    [provider]  verapamil (CALAN) 80 MG tablet Take 160 mg by mouth daily. 11/30/17   [provider]    Family History No family history on file.  Social History Social History   Tobacco Use  . Smoking status: Never Smoker  . Smokeless tobacco: Never Used  Substance Use Topics  . Alcohol use: Never    Frequency: Never  . Drug use: Never     Allergies   Patient has no known allergies.   Review of Systems Review of Systems  Respiratory: Positive for shortness of breath.   All other systems reviewed and are negative.    Physical Exam Updated Vital Signs BP (!) 187/92 (BP Location: Right Arm)   Pulse 79   Temp 98.3 F (36.8 C) (Oral)   Resp 13   Ht 5\' 9"  (1.753 m)   Wt 120.2 kg   SpO2 99%   BMI 39.13 kg/m   Physical Exam Vitals signs and nursing note reviewed.  Constitutional:      Appearance: He is well-developed.  HENT:     Head: Normocephalic.     Mouth/Throat:     Mouth: Mucous membranes are moist.  Eyes:     Extraocular Movements: Extraocular movements intact.     Pupils: Pupils are equal, round, and reactive to light.  Neck:  Musculoskeletal: Normal range of motion and neck supple.  Cardiovascular:     Rate and Rhythm: Normal rate and regular rhythm.  Pulmonary:     Effort: Pulmonary effort is normal.     Comments: Minimal crackles bilateral bases  Abdominal:     General: Bowel sounds are normal.     Palpations: Abdomen is soft.  Musculoskeletal: Normal range of motion.     Comments: 1+ edema bilateral legs   Skin:    General: Skin is warm.     Capillary Refill: Capillary refill takes less than 2 seconds.  Neurological:     General: No focal deficit present.     Mental Status: He is alert and oriented to person, place, and time.  Psychiatric:        Mood and Affect: Mood normal.        Behavior: Behavior normal.      ED Treatments / Results  Labs (all labs ordered are listed, but only abnormal results are  displayed) Labs Reviewed  CBC WITH DIFFERENTIAL/PLATELET  COMPREHENSIVE METABOLIC PANEL  BRAIN NATRIURETIC PEPTIDE  I-STAT TROPONIN, ED    EKG EKG Interpretation  Date/Time:  Monday September 03 2018 13:51:23 EST Ventricular Rate:  82 PR Interval:  170 QRS Duration: 84 QT Interval:  374 QTC Calculation: 436 R Axis:   12 Text Interpretation:  Normal sinus rhythm Normal ECG No previous ECGs available Confirmed by Wandra Arthurs [phone]) on 09/03/2018 3:59:18 PM   Radiology Dg Chest 2 View  Result Date: 09/03/2018 CLINICAL DATA:  Shortness of breath 2 weeks with minimal exertion. Mild chest pain. EXAM: CHEST - 2 VIEW COMPARISON:  None. FINDINGS: Lungs are adequately inflated without consolidation or effusion. Cardiomediastinal silhouette is within normal. There are mild degenerative changes of the spine. IMPRESSION: No active cardiopulmonary disease. Electronically Signed   By: Marin Olp M.D.   On: 09/03/2018 15:32    Procedures Procedures (including critical care time)  CRITICAL CARE Performed by: Wandra Arthurs   Total critical care time: 30 minutes  Critical care time was exclusive of separately billable procedures and treating other patients.  Critical care was necessary to treat or prevent imminent or life-threatening deterioration.  Critical care was time spent personally by me on the following activities: development of treatment plan with patient and/or surrogate as well as nursing, discussions with consultants, evaluation of patient's response to treatment, examination of patient, obtaining history from patient or surrogate, ordering and performing treatments and interventions, ordering and review of laboratory studies, ordering and review of radiographic studies, pulse oximetry and re-evaluation of patient's condition.   Medications Ordered in ED Medications - No data to display   Initial Impression / Assessment and Plan / ED Course  I have reviewed the triage vital  signs and the nursing notes.  Pertinent labs & imaging results that were available during my care of the patient were reviewed by me and considered in my medical decision making (see chart for details).       Kenneth [name] is a 68 y.o. male here with SOB with exertion. I am concerned for ACS vs hypertensive urgency. Patient is on 4 BP meds already and BP is still 180s in the ED. No symptoms to suggest dissection or PE. Will get labs, BNP, CXR. Will likely need admission.   6:46 PM Trop positive at 0.12. BNP 100. I called Dr. Radford Pax from cardiology. She thinks likely demand ischemia from underlying hypertension. Recommend holding heparin, continue BP meds, and  admission to hospitalist service. Cardiology will see only if troponin is rising or echo is abnormal or hospitalist service request.    Final Clinical Impressions(s) / ED Diagnoses   Final diagnoses:  None    ED Discharge Orders    None       Drenda Freeze, MD 09/03/18 [phone]

## 2018-09-04 ENCOUNTER — Observation Stay (HOSPITAL_BASED_OUTPATIENT_CLINIC_OR_DEPARTMENT_OTHER): Payer: Medicare Other

## 2018-09-04 ENCOUNTER — Encounter (HOSPITAL_COMMUNITY): Payer: Self-pay | Admitting: Internal Medicine

## 2018-09-04 ENCOUNTER — Observation Stay (HOSPITAL_COMMUNITY): Payer: Medicare Other

## 2018-09-04 DIAGNOSIS — R002 Palpitations: Secondary | ICD-10-CM

## 2018-09-04 DIAGNOSIS — R609 Edema, unspecified: Secondary | ICD-10-CM | POA: Diagnosis not present

## 2018-09-04 DIAGNOSIS — R7989 Other specified abnormal findings of blood chemistry: Secondary | ICD-10-CM

## 2018-09-04 DIAGNOSIS — R0602 Shortness of breath: Secondary | ICD-10-CM | POA: Diagnosis not present

## 2018-09-04 DIAGNOSIS — Z85820 Personal history of malignant melanoma of skin: Secondary | ICD-10-CM | POA: Diagnosis not present

## 2018-09-04 DIAGNOSIS — I7121 Aneurysm of the ascending aorta, without rupture: Secondary | ICD-10-CM

## 2018-09-04 DIAGNOSIS — I712 Thoracic aortic aneurysm, without rupture: Secondary | ICD-10-CM | POA: Diagnosis present

## 2018-09-04 DIAGNOSIS — I160 Hypertensive urgency: Secondary | ICD-10-CM | POA: Diagnosis not present

## 2018-09-04 DIAGNOSIS — R0600 Dyspnea, unspecified: Secondary | ICD-10-CM | POA: Diagnosis not present

## 2018-09-04 DIAGNOSIS — I10 Essential (primary) hypertension: Secondary | ICD-10-CM | POA: Diagnosis present

## 2018-09-04 DIAGNOSIS — I2699 Other pulmonary embolism without acute cor pulmonale: Secondary | ICD-10-CM

## 2018-09-04 HISTORY — DX: Other pulmonary embolism without acute cor pulmonale: I26.99

## 2018-09-04 HISTORY — DX: Palpitations: R00.2

## 2018-09-04 HISTORY — DX: Thoracic aortic aneurysm, without rupture: I71.2

## 2018-09-04 HISTORY — DX: Aneurysm of the ascending aorta, without rupture: I71.21

## 2018-09-04 LAB — COMPREHENSIVE METABOLIC PANEL
ALT: 29 U/L (ref 0–44)
AST: 34 U/L (ref 15–41)
Albumin: 3.9 g/dL (ref 3.5–5.0)
Alkaline Phosphatase: 47 U/L (ref 38–126)
Anion gap: 12 (ref 5–15)
BUN: 7 mg/dL — ABNORMAL LOW (ref 8–23)
CHLORIDE: 103 mmol/L (ref 98–111)
CO2: 22 mmol/L (ref 22–32)
Calcium: 8.9 mg/dL (ref 8.9–10.3)
Creatinine, Ser: 0.93 mg/dL (ref 0.61–1.24)
GFR calc Af Amer: >60 mL/min (ref >60)
GFR calc non Af Amer: >60 mL/min (ref >60)
Glucose, Bld: 114 mg/dL — ABNORMAL HIGH (ref 70–99)
Potassium: 3.8 mmol/L (ref 3.5–5.1)
Sodium: 137 mmol/L (ref 135–145)
Total Bilirubin: 1.1 mg/dL (ref 0.3–1.2)
Total Protein: 6.9 g/dL (ref 6.5–8.1)

## 2018-09-04 LAB — CBC
HCT: 40.7 % (ref 39.0–52.0)
Hemoglobin: 13.7 g/dL (ref 13.0–17.0)
MCH: 28 pg (ref 26.0–34.0)
MCHC: 33.7 g/dL (ref 30.0–36.0)
MCV: 83.1 fL (ref 80.0–100.0)
Platelets: 205 10*3/uL (ref 150–400)
RBC: 4.9 MIL/uL (ref 4.22–5.81)
RDW: 12.8 % (ref 11.5–15.5)
WBC: 7.4 10*3/uL (ref 4.0–10.5)
nRBC: 0 % (ref 0.0–0.2)

## 2018-09-04 LAB — HEPARIN LEVEL (UNFRACTIONATED)
HEPARIN UNFRACTIONATED: 0.76 [IU]/mL — AB (ref 0.30–0.70)
Heparin Unfractionated: 0.58 IU/mL (ref 0.30–0.70)

## 2018-09-04 LAB — TROPONIN I
Troponin I: 0.08 ng/mL (ref <0.03)
Troponin I: 0.07 ng/mL (ref <0.03)

## 2018-09-04 LAB — HEMOGLOBIN A1C
HEMOGLOBIN A1C: 5.8 % — AB (ref 4.8–5.6)
Mean Plasma Glucose: 119.76 mg/dL

## 2018-09-04 LAB — LIPID PANEL
CHOLESTEROL: 152 mg/dL (ref 0–200)
HDL: 46 mg/dL (ref >40)
LDL Cholesterol: 96 mg/dL (ref 0–99)
Total CHOL/HDL Ratio: 3.3 RATIO
Triglycerides: 51 mg/dL (ref <150)
VLDL: 10 mg/dL (ref 0–40)

## 2018-09-04 LAB — HIV ANTIBODY (ROUTINE TESTING W REFLEX): HIV Screen 4th Generation wRfx: NONREACTIVE

## 2018-09-04 LAB — MAGNESIUM: Magnesium: 2 mg/dL (ref 1.7–2.4)

## 2018-09-04 LAB — TSH: TSH: 3.783 u[IU]/mL (ref 0.350–4.500)

## 2018-09-04 LAB — PHOSPHORUS: PHOSPHORUS: 3.8 mg/dL (ref 2.5–4.6)

## 2018-09-04 LAB — ECHOCARDIOGRAM COMPLETE
Height: 69 in
Weight: 4166.4 oz

## 2018-09-04 MED ORDER — IOPAMIDOL (ISOVUE-370) INJECTION 76%
INTRAVENOUS | Status: AC
  Filled 2018-09-04: qty 100

## 2018-09-04 MED ORDER — CARVEDILOL 6.25 MG PO TABS
6.2500 mg | ORAL_TABLET | Freq: Two times a day (BID) | ORAL | Status: DC
  Administered 2018-09-04 – 2018-09-05 (×2): 6.25 mg via ORAL
  Filled 2018-09-04 (×2): qty 1

## 2018-09-04 MED ORDER — HYDROCHLOROTHIAZIDE 12.5 MG PO CAPS
12.5000 mg | ORAL_CAPSULE | Freq: Every day | ORAL | Status: DC
  Administered 2018-09-04 – 2018-09-05 (×2): 12.5 mg via ORAL
  Filled 2018-09-04 (×2): qty 1

## 2018-09-04 MED ORDER — IOPAMIDOL (ISOVUE-370) INJECTION 76%
100.0000 mL | Freq: Once | INTRAVENOUS | Status: AC | PRN
  Administered 2018-09-04: 100 mL via INTRAVENOUS

## 2018-09-04 MED ORDER — HYDRALAZINE HCL 20 MG/ML IJ SOLN: 5.0000 mg | INTRAMUSCULAR | Status: DC | PRN

## 2018-09-04 MED ORDER — HEPARIN BOLUS VIA INFUSION
4000.0000 [IU] | Freq: Once | INTRAVENOUS | Status: AC
  Administered 2018-09-04: 4000 [IU] via INTRAVENOUS
  Filled 2018-09-04: qty 4000

## 2018-09-04 MED ORDER — HEPARIN (PORCINE) 25000 UT/250ML-% IV SOLN
1700.0000 [IU]/h | INTRAVENOUS | Status: DC
  Administered 2018-09-04: 1700 [IU]/h via INTRAVENOUS
  Administered 2018-09-04: 1800 [IU]/h via INTRAVENOUS
  Administered 2018-09-05: 1700 [IU]/h via INTRAVENOUS
  Filled 2018-09-04 (×4): qty 250

## 2018-09-04 MED ORDER — ROSUVASTATIN CALCIUM 20 MG PO TABS
40.0000 mg | ORAL_TABLET | Freq: Every day | ORAL | Status: DC
  Administered 2018-09-04: 40 mg via ORAL
  Filled 2018-09-04: qty 2

## 2018-09-04 NOTE — Care Management (Signed)
#  1.   Kenneth [name]  [facility]   @   [place] #   [phone]  1. ELIQUIS  5 MG BID COVER- YES CO-PAY-  $ 47.00   Q/L TWO PILL PER DAY TIER-  3 DRUG  PRIOR APPROVAL- NO  2.  XARELTO 10 MG DAILY COVER- YES CO-PAY- $ 47.00  Q/L ONE PILL PER DAY TIER- 3 DRUG PRIOR APPROVAL- NO  DEDUCTIBLE : NOT MET  PREFERRED PHARMACY :  YES [facility], [facility] AND [facility]

## 2018-09-04 NOTE — Consult Note (Signed)
Cardiology Consultation:   Patient ID: Kenneth [name] MRN: [id number]; DOB: [birth year]  Admit date: 09/03/2018 Date of Consult: 09/04/2018  Primary Care Provider: [name], MD Primary Cardiologist: New; Dr Stanford Breed   Patient Profile:   Kenneth [name] is a 68 y.o. male with a hx of hypertension, hyperlipidemia, melanoma and now status post pulmonary embolus who is being seen today for the evaluation of elevated troponin, hypertension and dilated thoracic aorta at the request of Toy Baker MD.  History of Present Illness:   Patient complains of increased dyspnea on exertion for 2 months predominantly when walking up hills.  No chest pain.  However 2 days prior to admission he awoke with severe shortness of breath which was much worse.  He was admitted and d-dimer noted to be elevated.  CTA showed bilateral subsegmental pulmonary emboli, dilated aortic root at 4.2 cm and coronary calcification.  Troponin also minimally elevated and cardiology asked to evaluate.  Note he denies recent travel, leg injury or recent surgery.  Past Medical History:  Diagnosis Date  . Gout   . Hypercholesteremia   . Hypertension   . Nephrolithiasis   . Pulmonary embolism [facility])     Past Surgical History:  Procedure Laterality Date  . No prior surgery         Inpatient Medications: Scheduled Meds: . allopurinol  200 mg Oral Daily  . cloNIDine  0.3 mg Oral Daily  . finasteride  5 mg Oral Daily  . iopamidol      . losartan  100 mg Oral Daily  . pravastatin  20 mg Oral q1800  . verapamil  160 mg Oral Daily   Continuous Infusions: . heparin 1,800 Units/hr (09/04/18 0144)   PRN Meds: acetaminophen **OR** acetaminophen, hydrALAZINE, HYDROcodone-acetaminophen, ondansetron **OR** ondansetron (ZOFRAN) IV  Allergies:   No Known Allergies  Social History:   Social History   Socioeconomic History  . Marital status: Married    Spouse name: Not on file  . Number of children: Not on  file  . Years of education: Not on file  . Highest education level: Not on file  Occupational History  . Not on file  Social Needs  . Financial resource strain: Not on file  . Food insecurity:    Worry: Not on file    Inability: Not on file  . Transportation needs:    Medical: Not on file    Non-medical: Not on file  Tobacco Use  . Smoking status: Never Smoker  . Smokeless tobacco: Never Used  Substance and Sexual Activity  . Alcohol use: Never    Frequency: Never  . Drug use: Never  . Sexual activity: Not on file  Lifestyle  . Physical activity:    Days per week: Not on file    Minutes per session: Not on file  . Stress: Not on file  Relationships  . Social connections:    Talks on phone: Not on file    Gets together: Not on file    Attends religious service: Not on file    Active member of club or organization: Not on file    Attends meetings of clubs or organizations: Not on file    Relationship status: Not on file  . Intimate partner violence:    Fear of current or ex partner: Not on file    Emotionally abused: Not on file    Physically abused: Not on file    Forced sexual activity: Not on file  Other Topics  Concern  . Not on file  Social History Narrative  . Not on file    Family History:    Family History  Problem Relation Age of Onset  . Diabetes Mother   . Hypertension Mother   . CAD Brother        CABG at age 76     ROS:  Please see the history of present illness.  Patient denies fevers, chills, productive cough, hemoptysis.  He does describe some weakness.  No recent change in weight. All other ROS reviewed and negative.     Physical Exam/Data:   Vitals:   09/03/18 2125 09/04/18 0124 09/04/18 0441 09/04/18 0800  BP:  (!) 178/83 (!) 172/86 (!) 163/91  Pulse:  68 68 67  Resp:   18 15  Temp:  98.9 F (37.2 C) 98.2 F (36.8 C) 98 F (36.7 C)  TempSrc:  Oral Oral Oral  SpO2:  96% 98% 94%  Weight: 117.6 kg  118.1 kg   Height:         Intake/Output Summary (Last 24 hours) at 09/04/2018 1106 Last data filed at 09/04/2018 0719 Gross per 24 hour  Intake -  Output 375 ml  Net -375 ml   Last 3 Weights 09/04/2018 09/03/2018 09/03/2018  Weight (lbs) 260 lb 6.4 oz 259 lb 4.8 oz 265 lb  Weight (kg) 118.117 kg 117.618 kg 120.203 kg     Body mass index is 38.45 kg/m.  General:  Well nourished, obese, in no acute distress HEENT: normal Lymph: no adenopathy Neck: no JVD Endocrine:  No thryomegaly Vascular: No carotid bruits; FA pulses 2+ bilaterally without bruits  Cardiac:  normal S1, S2; RRR; 1/6 SEM Lungs:  clear to auscultation bilaterally, no wheezing, rhonchi or rales  Abd: soft, nontender, no hepatomegaly  Ext: no edema Musculoskeletal:  No deformities, BUE and BLE strength normal and equal Skin: warm and dry  Neuro:  CNs 2-12 intact, no focal abnormalities noted Psych:  Normal affect   EKG:  The EKG was personally reviewed and demonstrates: Normal sinus rhythm, left ventricular hypertrophy, poor R wave progression. Telemetry:  Telemetry was personally reviewed and demonstrates: Normal sinus rhythm  Laboratory Data:  Chemistry Recent Labs  Lab 09/03/18 1641 09/04/18 0733  NA 138 137  K 3.9 3.8  CL 104 103  CO2 24 22  GLUCOSE 97 114*  BUN 10 7*  CREATININE 0.98 0.93  CALCIUM 9.1 8.9  GFRNONAA >60 >60  GFRAA >60 >60  ANIONGAP 10 12    Recent Labs  Lab 09/03/18 1641 09/04/18 0733  PROT 7.1 6.9  ALBUMIN 4.1 3.9  AST 35 34  ALT 30 29  ALKPHOS 49 47  BILITOT 0.7 1.1   Hematology Recent Labs  Lab 09/03/18 1641 09/04/18 0733  WBC 8.3 7.4  RBC 5.14 4.90  HGB 13.9 13.7  HCT 43.3 40.7  MCV 84.2 83.1  MCH 27.0 28.0  MCHC 32.1 33.7  RDW 12.7 12.8  PLT 220 205   Cardiac Enzymes Recent Labs  Lab 09/03/18 1706 09/03/18 2043 09/03/18 2159 09/04/18 0412 09/04/18 0733  TROPONINI 0.12* 0.12* 0.11* 0.08* 0.07*    Recent Labs  Lab 09/03/18 1652  TROPIPOC 0.09*    BNP Recent Labs   Lab 09/03/18 1641  BNP 119.4*    DDimer  Recent Labs  Lab 09/03/18 2043  DDIMER 6.59*    Radiology/Studies:  Dg Chest 2 View  Result Date: 09/03/2018 CLINICAL DATA:  Shortness of breath 2 weeks with minimal exertion.  Mild chest pain. EXAM: CHEST - 2 VIEW COMPARISON:  None. FINDINGS: Lungs are adequately inflated without consolidation or effusion. Cardiomediastinal silhouette is within normal. There are mild degenerative changes of the spine. IMPRESSION: No active cardiopulmonary disease. Electronically Signed   By: Marin Olp M.D.   On: 09/03/2018 15:32   Ct Angio Chest Pe W Or Wo Contrast  Result Date: 09/04/2018 CLINICAL DATA:  68 y/o  M; PE suspected, high pretest prob. EXAM: CT ANGIOGRAPHY CHEST WITH CONTRAST TECHNIQUE: Multidetector CT imaging of the chest was performed using the standard protocol during bolus administration of intravenous contrast. Multiplanar CT image reconstructions and MIPs were obtained to evaluate the vascular anatomy. CONTRAST:  163mL ISOVUE-370 IOPAMIDOL (ISOVUE-370) INJECTION 76% COMPARISON:  09/03/2018 chest radiograph FINDINGS: Cardiovascular: Multiple acute segmental pulmonary emboli in the lower lobes. RV/LV = 0.82. Normal heart size. No pericardial effusion. Ascending thoracic aorta measures 4.2 cm. Moderate coronary artery calcific atherosclerosis. Aberrant right subclavian artery, variant anatomy. Mediastinum/Nodes: No enlarged mediastinal, hilar, or axillary lymph nodes. Thyroid gland, trachea, and esophagus demonstrate no significant findings. Lungs/Pleura: Lungs are clear. No pleural effusion or pneumothorax. Upper Abdomen: No acute abnormality. Musculoskeletal: No chest wall abnormality. No acute or significant osseous findings. Review of the MIP images confirms the above findings. IMPRESSION: 1. Multiple acute segmental pulmonary emboli. No CT findings of right heart strain. 2. Ascending thoracic aortic aneurysm measuring 4.2 cm. Recommend annual  imaging followup by CTA or MRA. This recommendation follows 2010 ACCF/AHA/AATS/ACR/ASA/SCA/SCAI/SIR/STS/SVM Guidelines for the Diagnosis and Management of Patients with Thoracic Aortic Disease. 2010; 121: X937-J696. 3. Moderate coronary artery calcific atherosclerosis. These results will be called to the ordering clinician or representative by the Radiologist Assistant, and communication documented in the PACS or zVision Dashboard. Electronically Signed   By: Kristine Garbe M.D.   On: 09/04/2018 00:56   Vas [place] Lower Extremity Venous (dvt)  Result Date: 09/04/2018  Lower Venous Study Indications: SOB, and pulmonary embolism.  Risk Factors: Confirmed PE. Performing Technologist: Toma Copier RVS  Examination Guidelines: A complete evaluation includes B-mode imaging, spectral Doppler, color Doppler, and power Doppler as needed of all accessible portions of each vessel. Bilateral testing is considered an integral part of a complete examination. Limited examinations for reoccurring indications may be performed as noted.  Right Venous Findings: +---------+---------------+---------+-----------+----------+-------+          CompressibilityPhasicitySpontaneityPropertiesSummary +---------+---------------+---------+-----------+----------+-------+ CFV      Full           Yes      Yes                          +---------+---------------+---------+-----------+----------+-------+ SFJ      Full                                                 +---------+---------------+---------+-----------+----------+-------+ FV Prox  Full           Yes      Yes                          +---------+---------------+---------+-----------+----------+-------+ FV Mid   Full                                                 +---------+---------------+---------+-----------+----------+-------+  FV DistalFull           Yes      Yes                           +---------+---------------+---------+-----------+----------+-------+ PFV      Full           Yes      Yes                          +---------+---------------+---------+-----------+----------+-------+ POP      Full           Yes      Yes                          +---------+---------------+---------+-----------+----------+-------+ PTV      Full                                                 +---------+---------------+---------+-----------+----------+-------+ PERO     Full                                                 +---------+---------------+---------+-----------+----------+-------+  Left Venous Findings: +---------+---------------+---------+-----------+----------+-------+          CompressibilityPhasicitySpontaneityPropertiesSummary +---------+---------------+---------+-----------+----------+-------+ CFV      Full           Yes      Yes                          +---------+---------------+---------+-----------+----------+-------+ SFJ      Full                                                 +---------+---------------+---------+-----------+----------+-------+ FV Prox  Full           Yes      Yes                          +---------+---------------+---------+-----------+----------+-------+ FV Mid   Full                                                 +---------+---------------+---------+-----------+----------+-------+ FV DistalFull           Yes      Yes                          +---------+---------------+---------+-----------+----------+-------+ PFV      Full           Yes      Yes                          +---------+---------------+---------+-----------+----------+-------+ POP      Full           Yes      Yes                          +---------+---------------+---------+-----------+----------+-------+  PTV      Full                                                  +---------+---------------+---------+-----------+----------+-------+ PERO     Full                                                 +---------+---------------+---------+-----------+----------+-------+    Summary: Right: There is no evidence of deep vein thrombosis in the lower extremity. No cystic structure found in the popliteal fossa. Left: There is no evidence of deep vein thrombosis in the lower extremity. No cystic structure found in the popliteal fossa.  *See table(s) above for measurements and observations.    Preliminary     Assessment and Plan:   1. Pulmonary embolus-patient presented with acute dyspnea and CTA shows pulmonary embolus.  Would transition to either apixaban or Xarelto and will need 6 months of therapy.  Precipitating factor unclear as he has not had recent travel, surgery.  No family history of hypercoagulable state.  Would likely have hematology evaluate as an outpatient for possible need for hypercoagulable work-up.  Echocardiogram is pending to assess RV size and function. 2. Elevated troponin-minimal elevation with no clear trend and patient has not had chest pain.  Electrocardiogram shows no ST changes.  Likely related to recent pulmonary embolus. 3. Coronary calcification-noted on CT scan.  Would continue statin.  Discontinue aspirin given need for anticoagulation.  Once he has been treated for his pulmonary embolus for 3 to 4 months we will arrange an outpatient stress nuclear study for risk stratification. 4. Palpitations-etiology unclear.  Telemetry personally reviewed with no significant arrhythmias.  We will arrange an outpatient event monitor to further assess.  Echocardiogram is pending. 5. Thoracic aortic aneurysm-4.2 cm on recent CTA.  We will plan repeat study February 2021. 6. Hypertension-patient's blood pressure is elevated.  Would continue losartan at present dose.  Add HCTZ 12.5 mg daily.  Check potassium and renal function in 1 week.  Discontinue  verapamil.  Add carvedilol 6.25 mg twice daily both for blood pressure and palpitations.  Titrate medications based on follow-up readings. 7. Hyperlipidemia-given coronary calcification on CT scan would discontinue pravastatin and will treat with Crestor 40 mg daily.  Check lipids and liver in 6 to 8 weeks.  For questions or updates, please contact [place] Please consult [email or web address] for contact info under     Signed, Kirk Ruths, MD  09/04/2018 11:06 AM

## 2018-09-04 NOTE — Progress Notes (Signed)
ANTICOAGULATION CONSULT NOTE - Initial Consult  Pharmacy Consult for heparin Indication: pulmonary embolus  No Known Allergies  Patient Measurements: Height: 5\' 9"  (175.3 cm) Weight: 259 lb 4.8 oz (117.6 kg) IBW/kg (Calculated) : 70.7 Heparin Dosing Weight: 100kg  Vital Signs: Temp: 98.9 F (37.2 C) (02/25 0124) Temp Source: Oral (02/25 0124) BP: 178/83 (02/25 0124) Pulse Rate: 68 (02/25 0124)  Labs: Recent Labs    09/03/18 1641 09/03/18 1706 09/03/18 2043 09/03/18 2159  HGB 13.9  --   --   --   HCT 43.3  --   --   --   PLT 220  --   --   --   CREATININE 0.98  --   --   --   TROPONINI  --  0.12* 0.12* 0.11*    Estimated Creatinine Clearance: 92.6 mL/min (by C-G formula based on SCr of 0.98 mg/dL).   Medical History: Past Medical History:  Diagnosis Date  . Gout   . Hypercholesteremia   . Hypertension     Assessment: 68yo male c/o SOB w/ exertion x4d, has h/o Afib, not on anticoagulation, saw cardiologist and was in NSR, CT reveals multiple acute segmental PEs, to begin heparin.  Goal of Therapy:  Heparin level 0.3-0.7 units/ml Monitor platelets by anticoagulation protocol: Yes   Plan:  Rec'd DVT Px LMWH <4h ago; will give heparin 4000 units IV bolus x1 followed by gtt at 1800 units/hr and monitor heparin levels and CBC.  Wynona Neat, PharmD, BCPS  09/04/2018,1:26 AM

## 2018-09-04 NOTE — Progress Notes (Signed)
[facility] PROGRESS NOTE  KHOA OPDAHL HDQ:[id number] DOB: Nov 04, 1950 DOA: 09/03/2018 PCP: [name], MD  Assessment/Plan:  . Dyspnea/Pulmonary embolism -.CTreveals multiple acute segmental pulmonary emboli no right heart strain. Awaiting  echogram.  Patient states he is very sedentary in the winter but also has history of melanoma. Remains short of breath but denies chest pain -continue heparin -will need anti-coage for at least 6 months.  -mobilize   . Hypertensive urgency - BP elevated. Home meds include clonidine, cozaar, verapamil. -continue home meds -monitor -prn hydralazine  . Elevated troponin -in the setting of pulmonary embolism. Trending down. No chest pain.  -await cardiology recommendations -continue heparin gtt   Palpitations intermittent. No events on tele. TSH 3.7  Aortic aneurysm-will need better blood pressure control at the time of discharge once stable will need to have regular follow-up with vascular surgeon repeat imaging to monitor  Coronary artery calcification -with family history of significant coronary artery disease in his brother patient is still as high possibility of concomitant coronary artery disease. lipid panel within limits of normal. Home meds include statin. -await  cardiology input -follow echogram to evaluate for wall motion abnormality  Code Status: full Family Communication: none present Disposition Plan: home hopefully tomorrow   Consultants:  cardmaster  Procedures:  Echo  LE dopplar  Antibiotics:    HPI/Subjective: Presented with DOE 4 days work up reveals PE no heart strain, elevated troponin, elevated BP. Heparin started, echo and dopplar ordered, npo for cards evaluation.  Reports continued sob this am but no discomfort. Somewhat anxious/fearful  Objective: Vitals:   09/04/18 0441 09/04/18 0800  BP: (!) 172/86 (!) 163/91  Pulse: 68 67  Resp: 18 15  Temp: 98.2 F (36.8 C) 98 F (36.7 C)   SpO2: 98% 94%    Intake/Output Summary (Last 24 hours) at 09/04/2018 0930 Last data filed at 09/04/2018 0719 Gross per 24 hour  Intake -  Output 375 ml  Net -375 ml   Filed Weights   09/03/18 1641 09/03/18 2125 09/04/18 0441  Weight: 120.2 kg 117.6 kg 118.1 kg    Exam:   General:  Awake alert slightly anxious no acute distress  Cardiovascular: rrr no mgr no LE edema  Respiratory: normal effort BS clear bilaterally no wheeze  Abdomen: obese soft +BS no guarding or rebounding  Musculoskeletal: joints without swelling/erythema   Data Reviewed: Basic Metabolic Panel: Recent Labs  Lab 09/03/18 1641 09/04/18 0733  NA 138 137  K 3.9 3.8  CL 104 103  CO2 24 22  GLUCOSE 97 114*  BUN 10 7*  CREATININE 0.98 0.93  CALCIUM 9.1 8.9  MG  --  2.0  PHOS  --  3.8   Liver Function Tests: Recent Labs  Lab 09/03/18 1641 09/04/18 0733  AST 35 34  ALT 30 29  ALKPHOS 49 47  BILITOT 0.7 1.1  PROT 7.1 6.9  ALBUMIN 4.1 3.9   No results for input(s): LIPASE, AMYLASE in the last 168 hours. No results for input(s): AMMONIA in the last 168 hours. CBC: Recent Labs  Lab 09/03/18 1641 09/04/18 0733  WBC 8.3 7.4  NEUTROABS 5.4  --   HGB 13.9 13.7  HCT 43.3 40.7  MCV 84.2 83.1  PLT 220 205   Cardiac Enzymes: Recent Labs  Lab 09/03/18 1706 09/03/18 2043 09/03/18 2159 09/04/18 0412 09/04/18 0733  TROPONINI 0.12* 0.12* 0.11* 0.08* 0.07*   BNP (last 3 results) Recent Labs    09/03/18 1641  BNP 119.4*  ProBNP (last 3 results) No results for input(s): PROBNP in the last 8760 hours.  CBG: No results for input(s): GLUCAP in the last 168 hours.  No results found for this or any previous visit (from the past 240 hour(s)).   Studies: Dg Chest 2 View  Result Date: 09/03/2018 CLINICAL DATA:  Shortness of breath 2 weeks with minimal exertion. Mild chest pain. EXAM: CHEST - 2 VIEW COMPARISON:  None. FINDINGS: Lungs are adequately inflated without consolidation or  effusion. Cardiomediastinal silhouette is within normal. There are mild degenerative changes of the spine. IMPRESSION: No active cardiopulmonary disease. Electronically Signed   By: Marin Olp M.D.   On: 09/03/2018 15:32   Ct Angio Chest Pe W Or Wo Contrast  Result Date: 09/04/2018 CLINICAL DATA:  68 y/o  M; PE suspected, high pretest prob. EXAM: CT ANGIOGRAPHY CHEST WITH CONTRAST TECHNIQUE: Multidetector CT imaging of the chest was performed using the standard protocol during bolus administration of intravenous contrast. Multiplanar CT image reconstructions and MIPs were obtained to evaluate the vascular anatomy. CONTRAST:  193mL ISOVUE-370 IOPAMIDOL (ISOVUE-370) INJECTION 76% COMPARISON:  09/03/2018 chest radiograph FINDINGS: Cardiovascular: Multiple acute segmental pulmonary emboli in the lower lobes. RV/LV = 0.82. Normal heart size. No pericardial effusion. Ascending thoracic aorta measures 4.2 cm. Moderate coronary artery calcific atherosclerosis. Aberrant right subclavian artery, variant anatomy. Mediastinum/Nodes: No enlarged mediastinal, hilar, or axillary lymph nodes. Thyroid gland, trachea, and esophagus demonstrate no significant findings. Lungs/Pleura: Lungs are clear. No pleural effusion or pneumothorax. Upper Abdomen: No acute abnormality. Musculoskeletal: No chest wall abnormality. No acute or significant osseous findings. Review of the MIP images confirms the above findings. IMPRESSION: 1. Multiple acute segmental pulmonary emboli. No CT findings of right heart strain. 2. Ascending thoracic aortic aneurysm measuring 4.2 cm. Recommend annual imaging followup by CTA or MRA. This recommendation follows 2010 ACCF/AHA/AATS/ACR/ASA/SCA/SCAI/SIR/STS/SVM Guidelines for the Diagnosis and Management of Patients with Thoracic Aortic Disease. 2010; 121: P233-A076. 3. Moderate coronary artery calcific atherosclerosis. These results will be called to the ordering clinician or representative by the  Radiologist Assistant, and communication documented in the PACS or zVision Dashboard. Electronically Signed   By: Kristine Garbe M.D.   On: 09/04/2018 00:56    Scheduled Meds: . allopurinol  200 mg Oral Daily  . cloNIDine  0.3 mg Oral Daily  . finasteride  5 mg Oral Daily  . iopamidol      . losartan  100 mg Oral Daily  . pravastatin  20 mg Oral q1800  . verapamil  160 mg Oral Daily   Continuous Infusions: . heparin 1,800 Units/hr (09/04/18 0144)    Active Problems:   Hypertensive urgency   Elevated troponin   PE (pulmonary thromboembolism) (HCC)   Palpitations   Hypertension   Ascending aortic aneurysm ([place])    Time spent: 46 minutes    Fowler NP  [facility] If 7PM-7AM, please contact night-coverage at [email or web address], password [facility] 09/04/2018, 9:30 AM  LOS: 0 days

## 2018-09-04 NOTE — Progress Notes (Signed)
ANTICOAGULATION CONSULT NOTE - Initial Consult  Pharmacy Consult for heparin Indication: pulmonary embolus   Patient Measurements: Height: 5\' 9"  (175.3 cm) Weight: 260 lb 6.4 oz (118.1 kg) IBW/kg (Calculated) : 70.7 Heparin Dosing Weight: 100kg  Vital Signs: Temp: 98 F (36.7 C) (02/25 0800) Temp Source: Oral (02/25 0800) BP: 163/91 (02/25 0800) Pulse Rate: 67 (02/25 0800)  Labs: Recent Labs    09/03/18 1641  09/03/18 2159 09/04/18 0412 09/04/18 0733 09/04/18 0748  HGB 13.9  --   --   --  13.7  --   HCT 43.3  --   --   --  40.7  --   PLT 220  --   --   --  205  --   HEPARINUNFRC  --   --   --   --   --  0.76*  CREATININE 0.98  --   --   --  0.93  --   TROPONINI  --    < > 0.11* 0.08* 0.07*  --    < > = values in this interval not displayed.    Estimated Creatinine Clearance: 97.8 mL/min (by C-G formula based on SCr of 0.93 mg/dL).   Medical History: Past Medical History:  Diagnosis Date  . Gout   . Hypercholesteremia   . Hypertension   . Pulmonary embolism [facility])     Assessment: 68yo male c/o SOB w/ exertion x4d, has h/o Afib, not on anticoagulation, saw cardiologist and was in NSR, CT reveals multiple acute segmental PEs.Patient is on heparin infusion - level is slightly supratherapeutic. CBC wnl and stable.  Goal of Therapy:  Heparin level 0.3-0.7 units/ml Monitor platelets by anticoagulation protocol: Yes   Plan:  -Reduce heparin infusion to 1700 units/hr  -Check level in 6 hours -Daily HL, CBC   Harvel Quale 09/04/2018 10:58 AM

## 2018-09-04 NOTE — Progress Notes (Signed)
ANTICOAGULATION CONSULT NOTE  Pharmacy Consult for heparin Indication: pulmonary embolus   Patient Measurements: Height: 5\' 9"  (175.3 cm) Weight: 260 lb 6.4 oz (118.1 kg) IBW/kg (Calculated) : 70.7 Heparin Dosing Weight: 100kg  Vital Signs: Temp: 99 F (37.2 C) (02/25 1550) Temp Source: Oral (02/25 1550) BP: 147/81 (02/25 1550) Pulse Rate: 86 (02/25 1200)  Labs: Recent Labs    09/03/18 1641  09/03/18 2159 09/04/18 0412 09/04/18 0733 09/04/18 0748 09/04/18 1634  HGB 13.9  --   --   --  13.7  --   --   HCT 43.3  --   --   --  40.7  --   --   PLT 220  --   --   --  205  --   --   HEPARINUNFRC  --   --   --   --   --  0.76* 0.58  CREATININE 0.98  --   --   --  0.93  --   --   TROPONINI  --    < > 0.11* 0.08* 0.07*  --   --    < > = values in this interval not displayed.    Estimated Creatinine Clearance: 97.8 mL/min (by C-G formula based on SCr of 0.93 mg/dL).   Medical History: Past Medical History:  Diagnosis Date  . Gout   . Hypercholesteremia   . Hypertension   . Nephrolithiasis   . Pulmonary embolism [facility])     Assessment: 68yo male c/o SOB w/ exertion x4d, has h/o Afib, not on anticoagulation, saw cardiologist and was in NSR, CT reveals multiple acute segmental PEs. Patient is on heparin infusion and heparin level confirmed at goal  Goal of Therapy:  Heparin level 0.3-0.7 units/ml Monitor platelets by anticoagulation protocol: Yes   Plan:  -No heparin changes needed -Daily HL, CBC   Hildred Laser, PharmD Clinical Pharmacist **Pharmacist phone directory can now be found on [email or web address] ([email or web address]).  Listed under [place].

## 2018-09-04 NOTE — Progress Notes (Signed)
Benefit check in progress for NOAC; B Pennie Rushing [phone]

## 2018-09-04 NOTE — Progress Notes (Signed)
Bilateral lower extremity venous duplex completed. Preliminary results in Chart Review CV Proc. [facility], [place] 09/04/2018 10:19 AM

## 2018-09-04 NOTE — Plan of Care (Signed)

## 2018-09-04 NOTE — Progress Notes (Signed)
  Echocardiogram 2D Echocardiogram has been performed.  Kenneth [name] 09/04/2018, 9:52 AM

## 2018-09-05 DIAGNOSIS — I10 Essential (primary) hypertension: Secondary | ICD-10-CM | POA: Diagnosis not present

## 2018-09-05 DIAGNOSIS — R7989 Other specified abnormal findings of blood chemistry: Secondary | ICD-10-CM | POA: Diagnosis not present

## 2018-09-05 DIAGNOSIS — I2699 Other pulmonary embolism without acute cor pulmonale: Secondary | ICD-10-CM | POA: Diagnosis not present

## 2018-09-05 DIAGNOSIS — I2510 Atherosclerotic heart disease of native coronary artery without angina pectoris: Secondary | ICD-10-CM

## 2018-09-05 DIAGNOSIS — I712 Thoracic aortic aneurysm, without rupture: Secondary | ICD-10-CM | POA: Diagnosis not present

## 2018-09-05 LAB — BASIC METABOLIC PANEL
Anion gap: 10 (ref 5–15)
BUN: 11 mg/dL (ref 8–23)
CO2: 25 mmol/L (ref 22–32)
CREATININE: 0.93 mg/dL (ref 0.61–1.24)
Calcium: 8.9 mg/dL (ref 8.9–10.3)
Chloride: 103 mmol/L (ref 98–111)
GFR calc Af Amer: >60 mL/min (ref >60)
GFR calc non Af Amer: >60 mL/min (ref >60)
Glucose, Bld: 114 mg/dL — ABNORMAL HIGH (ref 70–99)
Potassium: 3.6 mmol/L (ref 3.5–5.1)
Sodium: 138 mmol/L (ref 135–145)

## 2018-09-05 LAB — CBC
HCT: 41.5 % (ref 39.0–52.0)
Hemoglobin: 13.5 g/dL (ref 13.0–17.0)
MCH: 27.2 pg (ref 26.0–34.0)
MCHC: 32.5 g/dL (ref 30.0–36.0)
MCV: 83.5 fL (ref 80.0–100.0)
Platelets: 215 10*3/uL (ref 150–400)
RBC: 4.97 MIL/uL (ref 4.22–5.81)
RDW: 12.7 % (ref 11.5–15.5)
WBC: 7 10*3/uL (ref 4.0–10.5)
nRBC: 0 % (ref 0.0–0.2)

## 2018-09-05 LAB — HEPARIN LEVEL (UNFRACTIONATED): HEPARIN UNFRACTIONATED: 0.56 [IU]/mL (ref 0.30–0.70)

## 2018-09-05 MED ORDER — CARVEDILOL 6.25 MG PO TABS: 6.2500 mg | ORAL_TABLET | Freq: Two times a day (BID) | ORAL | 0 refills | Status: DC

## 2018-09-05 MED ORDER — APIXABAN 5 MG PO TABS
10.0000 mg | ORAL_TABLET | Freq: Two times a day (BID) | ORAL | Status: DC
  Administered 2018-09-05: 10 mg via ORAL
  Filled 2018-09-05: qty 2

## 2018-09-05 MED ORDER — APIXABAN 5 MG PO TABS: 5.0000 mg | ORAL_TABLET | Freq: Two times a day (BID) | ORAL | Status: DC

## 2018-09-05 MED ORDER — HYDROCHLOROTHIAZIDE 12.5 MG PO CAPS: 12.5000 mg | ORAL_CAPSULE | Freq: Every day | ORAL | 0 refills | Status: DC

## 2018-09-05 MED ORDER — ROSUVASTATIN CALCIUM 40 MG PO TABS: 40.0000 mg | ORAL_TABLET | Freq: Every day | ORAL | 0 refills | Status: AC

## 2018-09-05 MED FILL — ROSUVASTATIN CALCIUM 40 MG: 40 | 30 days supply | Qty: 30 | Fill #0

## 2018-09-05 MED FILL — HYDROCHLOROTHIAZIDE 12.5 MG: 12.5 | 30 days supply | Qty: 30 | Fill #0

## 2018-09-05 MED FILL — CARVEDILOL 6.25 MG TABLET: 6.25 | 30 days supply | Qty: 60 | Fill #0

## 2018-09-05 NOTE — Progress Notes (Signed)
Patient is discharged home with family. Discharge instruction given to patient teaching on new medication provided to patient. Patient verbalized understanding by teaching back. All questions answered and concerns addressed. Patient verbalized understanding.

## 2018-09-05 NOTE — Discharge Instructions (Signed)
Information on my medicine - ELIQUIS (apixaban)  This medication education was reviewed with me or my healthcare representative as part of my discharge preparation.   Why was Eliquis prescribed for you? Eliquis was prescribed to treat blood clots that may have been found in the veins of your legs (deep vein thrombosis) or in your lungs (pulmonary embolism) and to reduce the risk of them occurring again.  What do You need to know about Eliquis ? The starting dose is 10 mg (two 5 mg tablets) taken TWICE daily for the FIRST SEVEN (7) DAYS, then on (enter date)  09/12/18  the dose is reduced to ONE 5 mg tablet taken TWICE daily.  Eliquis may be taken with or without food.   Try to take the dose about the same time in the morning and in the evening. If you have difficulty swallowing the tablet whole please discuss with your pharmacist how to take the medication safely.  Take Eliquis exactly as prescribed and DO NOT stop taking Eliquis without talking to the doctor who prescribed the medication.  Stopping may increase your risk of developing a new blood clot.  Refill your prescription before you run out.  After discharge, you should have regular check-up appointments with your healthcare provider that is prescribing your Eliquis.    What do you do if you miss a dose? If a dose of ELIQUIS is not taken at the scheduled time, take it as soon as possible on the same day and twice-daily administration should be resumed. The dose should not be doubled to make up for a missed dose.  Important Safety Information A possible side effect of Eliquis is bleeding. You should call your healthcare provider right away if you experience any of the following: ? Bleeding from an injury or your nose that does not stop. ? Unusual colored urine (red or dark brown) or unusual colored stools (red or black). ? Unusual bruising for unknown reasons. ? A serious fall or if you hit your head (even if there is no  bleeding).  Some medicines may interact with Eliquis and might increase your risk of bleeding or clotting while on Eliquis. To help avoid this, consult your healthcare provider or pharmacist prior to using any new prescription or non-prescription medications, including herbals, vitamins, non-steroidal anti-inflammatory drugs (NSAIDs) and supplements.  This website has more information on Eliquis (apixaban): http://www.eliquis.com/eliquis/home

## 2018-09-05 NOTE — Plan of Care (Signed)

## 2018-09-05 NOTE — Progress Notes (Signed)
ANTICOAGULATION CONSULT NOTE  Pharmacy Consult:  Heparin >> Eliquis Indication: pulmonary embolus   Patient Measurements: Height: 5\' 9"  (175.3 cm) Weight: 256 lb 4.8 oz (116.3 kg) IBW/kg (Calculated) : 70.7 Heparin Dosing Weight: 100kg  Vital Signs: Temp: 98.9 F (37.2 C) (02/26 0550) Temp Source: Oral (02/26 0550) BP: 139/79 (02/26 0550) Pulse Rate: 56 (02/26 0550)  Labs: Recent Labs    09/03/18 1641  09/03/18 2159 09/04/18 0412 09/04/18 0733 09/04/18 0748 09/04/18 1634 09/05/18 0439  HGB 13.9  --   --   --  13.7  --   --  13.5  HCT 43.3  --   --   --  40.7  --   --  41.5  PLT 220  --   --   --  205  --   --  215  HEPARINUNFRC  --   --   --   --   --  0.76* 0.58 0.56  CREATININE 0.98  --   --   --  0.93  --   --  0.93  TROPONINI  --    < > 0.11* 0.08* 0.07*  --   --   --    < > = values in this interval not displayed.    Estimated Creatinine Clearance: 96.9 mL/min (by C-G formula based on SCr of 0.93 mg/dL).   Assessment: Kenneth [name] complaining of SOB with exertion x4 days.  CT showed multiple acute segmental PEs.  He has a history of Afib not on AC PTA.  Pharmacy consulted to transition from IV heparin to Eliquis.  CBC stable; no bleeding reported.  Goal of Therapy:  Appropriate anticoagulation Monitor platelets by anticoagulation protocol: Yes   Plan:  Stop IV heparin when Eliquis is administered Eliquis 10mg  PO BID x 7 days, then on 09/12/18 start 5mg  PO BID Pharmacy will sign off and follow peripherally.  Thank you for the consult!  Rubyann Lingle D. Mina Marble, PharmD, BCPS, [place] 09/05/2018, 7:51 AM

## 2018-09-05 NOTE — Discharge Summary (Signed)
Physician Discharge Summary  Kenneth [name] UMP:[id number] DOB: August 14, 1950 DOA: 09/03/2018  PCP: [name], MD  Admit date: 09/03/2018 Discharge date: 09/05/2018  Admitted From: home  Discharge disposition: home    Recommendations for Outpatient Follow-Up:   1. Outpatient cardiology follow up: repeat CT scan, stress test and adjustment of medications 2. eliquis x 6 months-- consider hematology referral-- ? Provoked-- as he is more sedentary 3. BMp 1 week, LFTs/lipids in 6 weeks 4. Close follow up with opthamology   Discharge Diagnosis:   Active Problems:   Hypertensive urgency   Elevated troponin   Ascending aortic aneurysm (HCC)   PE (pulmonary thromboembolism) ([place])   Hypertension   Palpitations    Discharge Condition: Improved.  Diet recommendation: Low sodium, heart healthy  Wound care: None.  Code status: Full.   History of Present Illness:   Kenneth [name] is a 68 y.o. male with medical history significant of history of irregular heart beat, HTN and HLD, GOUT, melanoma 20 y ago    Presented with less of breath with exertion for past 4 days somewhat better with rest lower extremity swelling, he has hard time walking even 50 ft with out getting very short of breath. No pain with deep breathing Last time seen by cardiology yesterday he has been having some twinges in his left chest his family is concerned because of a similar to prior episode prior to having a CABG.  Has been feeling weak overall together. Been having chest pressure-like for the past 2 weeks. He was referred to go get an echogram as an outpatient but did not get it yet.  For the past few days he has been having much more shortness of breath with exertion.  No associated fevers or chills or cough no recent travel    Hospital Course by Problem:   Pulmonary embolus -patient presented with acute dyspnea and CTA shows pulmonary embolus.   -transition to eliquis for likely 6  months of therapy.  -? Provoked as not very active  Elevated troponin-minimal elevation with no clear trend and patient has not had chest pain.  Electrocardiogram shows no ST changes.  Likely related to recent pulmonary embolus. -outpatient stress test -echo: 1. The left ventricle has normal systolic function with an ejection fraction of 60-65%. The cavity size was normal. Left ventricular diastolic Doppler parameters are consistent with impaired relaxation No evidence of left ventricular regional wall  motion abnormalities.  Palpitations -tele unrevealing -outpatient event monitor per cards?  Thoracic aortic aneurysm-4.2 cm on recent CTA - repeat study February 2021.  Hypertension Per cards: continue losartan at present dose.  Add HCTZ 12.5 mg daily.  Check potassium and renal function in 1 week.  Discontinue verapamil.  Add carvedilol 6.25 mg twice daily both for blood pressure and palpitations.  Titrate medications based on follow-up readings.  Hyperlipidemia -coronary calcification on CT scan:discontinue pravastatin and will treat with Crestor 40 mg daily.  Check lipids and LFTs in 6 to 8 weeks    Medical Consultants:   cardiology   Discharge Exam:   Vitals:   09/05/18 0550 09/05/18 0815  BP: 139/79 (!) 151/93  Pulse: (!) 56 66  Resp: 18 16  Temp: 98.9 F (37.2 C)   SpO2: 93% 97%   Vitals:   09/04/18 2101 09/05/18 0248 09/05/18 0550 09/05/18 0815  BP: (!) 154/85  139/79 (!) 151/93  Pulse:   (!) 56 66  Resp:   18 16  Temp:  98.9 F (37.2 C)   TempSrc:   Oral   SpO2:   93% 97%  Weight:  116.3 kg    Height:        General exam: Appears calm and comfortable   The results of significant diagnostics from this hospitalization (including imaging, microbiology, ancillary and laboratory) are listed below for reference.     Procedures and Diagnostic Studies:   Dg Chest 2 View  Result Date: 09/03/2018 CLINICAL DATA:  Shortness of breath 2 weeks with minimal  exertion. Mild chest pain. EXAM: CHEST - 2 VIEW COMPARISON:  None. FINDINGS: Lungs are adequately inflated without consolidation or effusion. Cardiomediastinal silhouette is within normal. There are mild degenerative changes of the spine. IMPRESSION: No active cardiopulmonary disease. Electronically Signed   By: Marin Olp M.D.   On: 09/03/2018 15:32   Ct Angio Chest Pe W Or Wo Contrast  Result Date: 09/04/2018 CLINICAL DATA:  68 y/o  M; PE suspected, high pretest prob. EXAM: CT ANGIOGRAPHY CHEST WITH CONTRAST TECHNIQUE: Multidetector CT imaging of the chest was performed using the standard protocol during bolus administration of intravenous contrast. Multiplanar CT image reconstructions and MIPs were obtained to evaluate the vascular anatomy. CONTRAST:  136mL ISOVUE-370 IOPAMIDOL (ISOVUE-370) INJECTION 76% COMPARISON:  09/03/2018 chest radiograph FINDINGS: Cardiovascular: Multiple acute segmental pulmonary emboli in the lower lobes. RV/LV = 0.82. Normal heart size. No pericardial effusion. Ascending thoracic aorta measures 4.2 cm. Moderate coronary artery calcific atherosclerosis. Aberrant right subclavian artery, variant anatomy. Mediastinum/Nodes: No enlarged mediastinal, hilar, or axillary lymph nodes. Thyroid gland, trachea, and esophagus demonstrate no significant findings. Lungs/Pleura: Lungs are clear. No pleural effusion or pneumothorax. Upper Abdomen: No acute abnormality. Musculoskeletal: No chest wall abnormality. No acute or significant osseous findings. Review of the MIP images confirms the above findings. IMPRESSION: 1. Multiple acute segmental pulmonary emboli. No CT findings of right heart strain. 2. Ascending thoracic aortic aneurysm measuring 4.2 cm. Recommend annual imaging followup by CTA or MRA. This recommendation follows 2010 ACCF/AHA/AATS/ACR/ASA/SCA/SCAI/SIR/STS/SVM Guidelines for the Diagnosis and Management of Patients with Thoracic Aortic Disease. 2010; 121: X914-N829. 3. Moderate  coronary artery calcific atherosclerosis. These results will be called to the ordering clinician or representative by the Radiologist Assistant, and communication documented in the PACS or zVision Dashboard. Electronically Signed   By: Kristine Garbe M.D.   On: 09/04/2018 00:56   Vas [place] Lower Extremity Venous (dvt)  Result Date: 09/04/2018  Lower Venous Study Indications: SOB, and pulmonary embolism.  Risk Factors: Confirmed PE. Performing Technologist: Toma Copier RVS  Examination Guidelines: A complete evaluation includes B-mode imaging, spectral Doppler, color Doppler, and power Doppler as needed of all accessible portions of each vessel. Bilateral testing is considered an integral part of a complete examination. Limited examinations for reoccurring indications may be performed as noted.  Right Venous Findings: +---------+---------------+---------+-----------+----------+-------+          CompressibilityPhasicitySpontaneityPropertiesSummary +---------+---------------+---------+-----------+----------+-------+ CFV      Full           Yes      Yes                          +---------+---------------+---------+-----------+----------+-------+ SFJ      Full                                                 +---------+---------------+---------+-----------+----------+-------+  FV Prox  Full           Yes      Yes                          +---------+---------------+---------+-----------+----------+-------+ FV Mid   Full                                                 +---------+---------------+---------+-----------+----------+-------+ FV DistalFull           Yes      Yes                          +---------+---------------+---------+-----------+----------+-------+ PFV      Full           Yes      Yes                          +---------+---------------+---------+-----------+----------+-------+ POP      Full           Yes      Yes                           +---------+---------------+---------+-----------+----------+-------+ PTV      Full                                                 +---------+---------------+---------+-----------+----------+-------+ PERO     Full                                                 +---------+---------------+---------+-----------+----------+-------+  Left Venous Findings: +---------+---------------+---------+-----------+----------+-------+          CompressibilityPhasicitySpontaneityPropertiesSummary +---------+---------------+---------+-----------+----------+-------+ CFV      Full           Yes      Yes                          +---------+---------------+---------+-----------+----------+-------+ SFJ      Full                                                 +---------+---------------+---------+-----------+----------+-------+ FV Prox  Full           Yes      Yes                          +---------+---------------+---------+-----------+----------+-------+ FV Mid   Full                                                 +---------+---------------+---------+-----------+----------+-------+ FV DistalFull           Yes      Yes                          +---------+---------------+---------+-----------+----------+-------+  PFV      Full           Yes      Yes                          +---------+---------------+---------+-----------+----------+-------+ POP      Full           Yes      Yes                          +---------+---------------+---------+-----------+----------+-------+ PTV      Full                                                 +---------+---------------+---------+-----------+----------+-------+ PERO     Full                                                 +---------+---------------+---------+-----------+----------+-------+    Summary: Right: There is no evidence of deep vein thrombosis in the lower extremity. No cystic structure found in the popliteal  fossa. Left: There is no evidence of deep vein thrombosis in the lower extremity. No cystic structure found in the popliteal fossa.  *See table(s) above for measurements and observations. Electronically signed by Harold Barban MD on 09/04/2018 at 7:07:17 PM.    Final      Labs:   Basic Metabolic Panel: Recent Labs  Lab 09/03/18 1641 09/04/18 0733 09/05/18 0439  NA 138 137 138  K 3.9 3.8 3.6  CL 104 103 103  CO2 24 22 25   GLUCOSE 97 114* 114*  BUN 10 7* 11  CREATININE 0.98 0.93 0.93  CALCIUM 9.1 8.9 8.9  MG  --  2.0  --   PHOS  --  3.8  --    GFR Estimated Creatinine Clearance: 96.9 mL/min (by C-G formula based on SCr of 0.93 mg/dL). Liver Function Tests: Recent Labs  Lab 09/03/18 1641 09/04/18 0733  AST 35 34  ALT 30 29  ALKPHOS 49 47  BILITOT 0.7 1.1  PROT 7.1 6.9  ALBUMIN 4.1 3.9   No results for input(s): LIPASE, AMYLASE in the last 168 hours. No results for input(s): AMMONIA in the last 168 hours. Coagulation profile No results for input(s): INR, PROTIME in the last 168 hours.  CBC: Recent Labs  Lab 09/03/18 1641 09/04/18 0733 09/05/18 0439  WBC 8.3 7.4 7.0  NEUTROABS 5.4  --   --   HGB 13.9 13.7 13.5  HCT 43.3 40.7 41.5  MCV 84.2 83.1 83.5  PLT 220 205 215   Cardiac Enzymes: Recent Labs  Lab 09/03/18 1706 09/03/18 2043 09/03/18 2159 09/04/18 0412 09/04/18 0733  TROPONINI 0.12* 0.12* 0.11* 0.08* 0.07*   BNP: Invalid input(s): POCBNP CBG: No results for input(s): GLUCAP in the last 168 hours. D-Dimer Recent Labs    09/03/18 2043  DDIMER 6.59*   Hgb A1c Recent Labs    09/04/18 0412  HGBA1C 5.8*   Lipid Profile Recent Labs    09/04/18 0412  CHOL 152  HDL 46  LDLCALC 96  TRIG 51  CHOLHDL 3.3   Thyroid function studies Recent Labs    09/04/18 0412  TSH 3.783   Anemia work up No results for input(s): VITAMINB12, FOLATE, FERRITIN, TIBC, IRON, RETICCTPCT in the last 72 hours. Microbiology No results found for this or any  previous visit (from the past 240 hour(s)).   Discharge Instructions:   Discharge Instructions    Diet - low sodium heart healthy   Complete by:  As directed    Discharge instructions   Complete by:  As directed    Repeat lipid profile in 3 months, CT angiography of the aorta in 12 months, outpatient stress test Cardiology follow up in 3-4 weeks.   Increase activity slowly   Complete by:  As directed      Allergies as of 09/05/2018   No Known Allergies     Medication List    STOP taking these medications   ibuprofen 200 MG tablet Commonly known as:  ADVIL,MOTRIN   lovastatin 20 MG tablet Commonly known as:  MEVACOR   verapamil 80 MG tablet Commonly known as:  CALAN     TAKE these medications   allopurinol 100 MG tablet Commonly known as:  ZYLOPRIM Take 200 mg by mouth daily.   calcium carbonate 500 MG chewable tablet Commonly known as:  TUMS - dosed in mg elemental calcium Chew 1 tablet by mouth at bedtime as needed for indigestion or heartburn.   carvedilol 6.25 MG tablet Commonly known as:  COREG Take 1 tablet (6.25 mg total) by mouth 2 (two) times daily with a meal.   cloNIDine 0.3 MG tablet Commonly known as:  CATAPRES Take 0.3 mg by mouth daily.   finasteride 5 MG tablet Commonly known as:  PROSCAR Take 5 mg by mouth daily.   hydrochlorothiazide 12.5 MG capsule Commonly known as:  MICROZIDE Take 1 capsule (12.5 mg total) by mouth daily. Start taking on:  September 06, 2018   losartan 100 MG tablet Commonly known as:  COZAAR Take 100 mg by mouth daily.   rosuvastatin 40 MG tablet Commonly known as:  CRESTOR Take 1 tablet (40 mg total) by mouth daily at 6 PM.      Follow-up Information    [name], MD. Go on 09/13/2018.   Specialty:  Internal Medicine Why:  @11 :00am Contact information: [id number] [place] [address] [phone]            Time coordinating discharge: 25 min  Signed:  Geradine Girt DO  [facility] 09/05/2018, 11:40 AM

## 2018-09-05 NOTE — Progress Notes (Signed)
Progress Note  Patient Name: Kenneth [name] Date of Encounter: 09/05/2018  Primary Cardiologist: No primary care provider on file. Crenshaw (new)  Subjective   No further complaints of pleurisy or dyspnea. Echo shows normal left and right ventricular function.  No evidence of regional wall motion abnormalities.  Mild elevation in pulmonary artery pressures. Incidentally noted 4.2 cm dilation of the ascending thoracic aorta on CT scan.  No significant valvular abnormalities. Blood pressure remains mildly elevated, but multiple dedication changes were made just yesterday.  Inpatient Medications    Scheduled Meds: . allopurinol  200 mg Oral Daily  . apixaban  10 mg Oral BID   Followed by  . [START ON 09/12/2018] apixaban  5 mg Oral BID  . carvedilol  6.25 mg Oral BID WC  . cloNIDine  0.3 mg Oral Daily  . finasteride  5 mg Oral Daily  . hydrochlorothiazide  12.5 mg Oral Daily  . losartan  100 mg Oral Daily  . rosuvastatin  40 mg Oral q1800   Continuous Infusions: . heparin Stopped (09/05/18 0828)   PRN Meds: acetaminophen **OR** acetaminophen, hydrALAZINE, HYDROcodone-acetaminophen, ondansetron **OR** ondansetron (ZOFRAN) IV   Vital Signs    Vitals:   09/04/18 2101 09/05/18 0248 09/05/18 0550 09/05/18 0815  BP: (!) 154/85  139/79 (!) 151/93  Pulse:   (!) 56 66  Resp:   18 16  Temp:   98.9 F (37.2 C)   TempSrc:   Oral   SpO2:   93% 97%  Weight:  116.3 kg    Height:        Intake/Output Summary (Last 24 hours) at 09/05/2018 0840 Last data filed at 09/05/2018 0518 Gross per 24 hour  Intake 700 ml  Output 2650 ml  Net -1950 ml   Last 3 Weights 09/05/2018 09/04/2018 09/03/2018  Weight (lbs) 256 lb 4.8 oz 260 lb 6.4 oz 259 lb 4.8 oz  Weight (kg) 116.257 kg 118.117 kg 117.618 kg      Telemetry    Sinus rhythm- Personally Reviewed  ECG    No new tracing- Personally Reviewed  Physical Exam  Obese GEN: No acute distress.   Neck: No JVD Cardiac: RRR, no  murmurs, rubs, or gallops.  Respiratory: Clear to auscultation bilaterally. GI: Soft, nontender, non-distended  MS: No edema; No deformity. Neuro:  Nonfocal  Psych: Normal affect   Labs    Chemistry Recent Labs  Lab 09/03/18 1641 09/04/18 0733 09/05/18 0439  NA 138 137 138  K 3.9 3.8 3.6  CL 104 103 103  CO2 24 22 25   GLUCOSE 97 114* 114*  BUN 10 7* 11  CREATININE 0.98 0.93 0.93  CALCIUM 9.1 8.9 8.9  PROT 7.1 6.9  --   ALBUMIN 4.1 3.9  --   AST 35 34  --   ALT 30 29  --   ALKPHOS 49 47  --   BILITOT 0.7 1.1  --   GFRNONAA >60 >60 >60  GFRAA >60 >60 >60  ANIONGAP 10 12 10      Hematology Recent Labs  Lab 09/03/18 1641 09/04/18 0733 09/05/18 0439  WBC 8.3 7.4 7.0  RBC 5.14 4.90 4.97  HGB 13.9 13.7 13.5  HCT 43.3 40.7 41.5  MCV 84.2 83.1 83.5  MCH 27.0 28.0 27.2  MCHC 32.1 33.7 32.5  RDW 12.7 12.8 12.7  PLT 220 205 215    Cardiac Enzymes Recent Labs  Lab 09/03/18 2043 09/03/18 2159 09/04/18 0412 09/04/18 0733  TROPONINI 0.12* 0.11*  0.08* 0.07*    Recent Labs  Lab 09/03/18 1652  TROPIPOC 0.09*     BNP Recent Labs  Lab 09/03/18 1641  BNP 119.4*     DDimer  Recent Labs  Lab 09/03/18 2043  DDIMER 6.59*     Radiology    Dg Chest 2 View  Result Date: 09/03/2018 CLINICAL DATA:  Shortness of breath 2 weeks with minimal exertion. Mild chest pain. EXAM: CHEST - 2 VIEW COMPARISON:  None. FINDINGS: Lungs are adequately inflated without consolidation or effusion. Cardiomediastinal silhouette is within normal. There are mild degenerative changes of the spine. IMPRESSION: No active cardiopulmonary disease. Electronically Signed   By: Marin Olp M.D.   On: 09/03/2018 15:32   Ct Angio Chest Pe W Or Wo Contrast  Result Date: 09/04/2018 CLINICAL DATA:  68 y/o  M; PE suspected, high pretest prob. EXAM: CT ANGIOGRAPHY CHEST WITH CONTRAST TECHNIQUE: Multidetector CT imaging of the chest was performed using the standard protocol during bolus  administration of intravenous contrast. Multiplanar CT image reconstructions and MIPs were obtained to evaluate the vascular anatomy. CONTRAST:  122mL ISOVUE-370 IOPAMIDOL (ISOVUE-370) INJECTION 76% COMPARISON:  09/03/2018 chest radiograph FINDINGS: Cardiovascular: Multiple acute segmental pulmonary emboli in the lower lobes. RV/LV = 0.82. Normal heart size. No pericardial effusion. Ascending thoracic aorta measures 4.2 cm. Moderate coronary artery calcific atherosclerosis. Aberrant right subclavian artery, variant anatomy. Mediastinum/Nodes: No enlarged mediastinal, hilar, or axillary lymph nodes. Thyroid gland, trachea, and esophagus demonstrate no significant findings. Lungs/Pleura: Lungs are clear. No pleural effusion or pneumothorax. Upper Abdomen: No acute abnormality. Musculoskeletal: No chest wall abnormality. No acute or significant osseous findings. Review of the MIP images confirms the above findings. IMPRESSION: 1. Multiple acute segmental pulmonary emboli. No CT findings of right heart strain. 2. Ascending thoracic aortic aneurysm measuring 4.2 cm. Recommend annual imaging followup by CTA or MRA. This recommendation follows 2010 ACCF/AHA/AATS/ACR/ASA/SCA/SCAI/SIR/STS/SVM Guidelines for the Diagnosis and Management of Patients with Thoracic Aortic Disease. 2010; 121: D408-X448. 3. Moderate coronary artery calcific atherosclerosis. These results will be called to the ordering clinician or representative by the Radiologist Assistant, and communication documented in the PACS or zVision Dashboard. Electronically Signed   By: Kristine Garbe M.D.   On: 09/04/2018 00:56   Vas [place] Lower Extremity Venous (dvt)  Result Date: 09/04/2018  Lower Venous Study Indications: SOB, and pulmonary embolism.  Risk Factors: Confirmed PE. Performing Technologist: Toma Copier RVS  Examination Guidelines: A complete evaluation includes B-mode imaging, spectral Doppler, color Doppler, and power Doppler as  needed of all accessible portions of each vessel. Bilateral testing is considered an integral part of a complete examination. Limited examinations for reoccurring indications may be performed as noted.  Right Venous Findings: +---------+---------------+---------+-----------+----------+-------+          CompressibilityPhasicitySpontaneityPropertiesSummary +---------+---------------+---------+-----------+----------+-------+ CFV      Full           Yes      Yes                          +---------+---------------+---------+-----------+----------+-------+ SFJ      Full                                                 +---------+---------------+---------+-----------+----------+-------+ FV Prox  Full           Yes  Yes                          +---------+---------------+---------+-----------+----------+-------+ FV Mid   Full                                                 +---------+---------------+---------+-----------+----------+-------+ FV DistalFull           Yes      Yes                          +---------+---------------+---------+-----------+----------+-------+ PFV      Full           Yes      Yes                          +---------+---------------+---------+-----------+----------+-------+ POP      Full           Yes      Yes                          +---------+---------------+---------+-----------+----------+-------+ PTV      Full                                                 +---------+---------------+---------+-----------+----------+-------+ PERO     Full                                                 +---------+---------------+---------+-----------+----------+-------+  Left Venous Findings: +---------+---------------+---------+-----------+----------+-------+          CompressibilityPhasicitySpontaneityPropertiesSummary +---------+---------------+---------+-----------+----------+-------+ CFV      Full           Yes      Yes                           +---------+---------------+---------+-----------+----------+-------+ SFJ      Full                                                 +---------+---------------+---------+-----------+----------+-------+ FV Prox  Full           Yes      Yes                          +---------+---------------+---------+-----------+----------+-------+ FV Mid   Full                                                 +---------+---------------+---------+-----------+----------+-------+ FV DistalFull           Yes      Yes                          +---------+---------------+---------+-----------+----------+-------+  PFV      Full           Yes      Yes                          +---------+---------------+---------+-----------+----------+-------+ POP      Full           Yes      Yes                          +---------+---------------+---------+-----------+----------+-------+ PTV      Full                                                 +---------+---------------+---------+-----------+----------+-------+ PERO     Full                                                 +---------+---------------+---------+-----------+----------+-------+    Summary: Right: There is no evidence of deep vein thrombosis in the lower extremity. No cystic structure found in the popliteal fossa. Left: There is no evidence of deep vein thrombosis in the lower extremity. No cystic structure found in the popliteal fossa.  *See table(s) above for measurements and observations. Electronically signed by Harold Barban MD on 09/04/2018 at 7:07:17 PM.    Final     Cardiac Studies    1. The left ventricle has normal systolic function with an ejection fraction of 60-65%. The cavity size was normal. Left ventricular diastolic Doppler parameters are consistent with impaired relaxation No evidence of left ventricular regional wall  motion abnormalities.  2. The right ventricle has normal systolic function.  The cavity was normal. There is no increase in right ventricular wall thickness.  3. The mitral valve is normal in structure. No evidence of mitral valve stenosis. Trivial regurgitation.  4. The tricuspid valve is normal in structure.  5. The aortic valve is tricuspid Moderate calcification of the aortic valve. Aortic valve regurgitation is mild by color flow Doppler. no stenosis of the aortic valve.  6. The pulmonic valve was normal in structure.  7. The aortic root is normal in size and structure.  8. There is mild dilatation of the ascending aorta measuring 39 mm.  9. The inferior vena cava was normal in size with <50% respiratory variability. 10. No complete TR doppler jet so unable to estimate PA systolic pressure.   Patient Profile     68 y.o. male presenting with acute pulmonary embolism, incidentally found to have coronary calcification and a mild ascending aortic thoracic aneurysm on a background of obesity, hypertension, hyperlipidemia  Assessment & Plan     [facility] will sign off.   Medication Recommendations: Continue medications as prescribed at this time;  Further titration of antihypertensive medications will be necessary as an outpatient Other recommendations (labs, testing, etc): Repeat lipid profile in 3 months, CT angiography of the aorta in 12 months Follow up as an outpatient: We will make arrangements for follow-up as an outpatient in 3-4 weeks.  For questions or updates, please contact [place] Please consult [email or web address] for contact info under        Signed, Sanda Klein, MD  09/05/2018, 8:40 AM

## 2018-10-03 ENCOUNTER — Telehealth: Payer: Self-pay | Admitting: Cardiology

## 2018-10-03 NOTE — Telephone Encounter (Signed)
   Primary Cardiologist:  Kirk Ruths, MD   Patient contacted.  History reviewed.  No symptoms to suggest any unstable cardiac conditions.  He has complained of occasional irregular heart beats, no sustained tachycardia.  Based on discussion, with current pandemic situation, we will be postponing this appointment for Oletta Darter with a plan for f/u in 4-6 wks or sooner if feasible/necessary.  If symptoms change, he has been instructed to contact our office.   Routing to C19 CANCEL pool for tracking (P CV DIV CV19 CANCEL - reason for visit "other.") and assigning priority (1 = 4-6 wks, 2 = 6-12 wks, 3 = >12 wks).   Kerin Ransom, [place]  10/03/2018 9:53 AM         .

## 2018-10-04 ENCOUNTER — Ambulatory Visit: Payer: Medicare Other | Admitting: Cardiology

## 2018-10-17 NOTE — Telephone Encounter (Signed)
Called patient to discuss possibility of scheduling a virtual visit (video vs telephone). Left voicemail for patient to contact our office to schedule an E-visit with Dr. Stanford Breed or an APP on his team for within the next 4-6 weeks.   Abigail Butts, PA-C 10/17/18 3:30 PM

## 2018-10-19 ENCOUNTER — Telehealth: Payer: Self-pay

## 2018-10-19 NOTE — Telephone Encounter (Signed)
Called and left the patient a message about getting scheduled for a virtual visit with Dr. Stanford Breed. Asked that the patient to give our office a call to get scheduled for a virtual visit.

## 2018-10-23 ENCOUNTER — Telehealth: Payer: Self-pay | Admitting: *Deleted

## 2018-10-23 ENCOUNTER — Telehealth: Payer: Self-pay | Admitting: Cardiology

## 2018-10-23 MED ORDER — CARVEDILOL 12.5 MG PO TABS: 12.5000 mg | ORAL_TABLET | Freq: Two times a day (BID) | ORAL | 3 refills | Status: DC

## 2018-10-23 NOTE — Telephone Encounter (Signed)
Increase coreg to 12.5 mg BID; virtual ov Kirk Ruths

## 2018-10-23 NOTE — Telephone Encounter (Signed)
Spoke with pt and has noted elevated B/P over weekend ranging from 198-218/88-120 these readings are prior to meds and heart rate irreg over 100 at times and feels lightheaded and does note SOB with little activity Per pt B/P after meds were taken this am is still 175/107 and heart rate 84 Will forward to Dr Stanford Breed for review and recommendations ./cy

## 2018-10-23 NOTE — Telephone Encounter (Signed)
LVM to pre reg. 10-23-18 ST °

## 2018-10-23 NOTE — Telephone Encounter (Signed)
Spoke with pt, Aware of dr crenshaw's recommendations. New script sent to the pharmacy and Follow up scheduled  

## 2018-10-23 NOTE — Telephone Encounter (Signed)
New Message  Pt c/o BP issue: STAT if pt c/o blurred vision, one-sided weakness or slurred speech  1. What are your last 5 BP readings? 200/120 Today  2. Are you having any other symptoms (ex. Dizziness, headache, blurred vision, passed out)? Lightheaded   3. What is your BP issue? Pt woke up and his BP was Elevated and his heart was racing, he said its irregular and he gets lightheaded   Pt would like to see someone soon

## 2018-10-24 NOTE — Progress Notes (Signed)
Virtual Visit via Video Note changed to telephone visit as patient did not have smart phone.   This visit type was conducted due to national recommendations for restrictions regarding the COVID-19 Pandemic (e.g. social distancing) in an effort to limit this patient's exposure and mitigate transmission in our community.  Due to her co-morbid illnesses, this patient is at least at moderate risk for complications without adequate follow up.  This format is felt to be most appropriate for this patient at this time.  All issues noted in this document were discussed and addressed. Please refer to the patient's chart for her consent to telehealth for [facility].   Evaluation Performed:  Follow-up visit  Date:  10/25/2018   ID:  Kenneth, [name] September 22, 1950, MRN [id number]  Pt location-Home Provider location-[place], [place]  PCP:  [name], MD  Cardiologist:  Kirk Ruths, MD   Chief Complaint:  FU pulmonary embolus, TAA and CAD  History of Present Illness:    Pt admitted 2/20 with DOE; CTA showed bilateral subsegmental pulmonary emboli, dilated aortic root at 4.2 cm and coronary calcification. Troponin I 0.12. Echo showed normal LV function, G1DD, mild AI. Treated with apixaban. Also treated with statin for coronary calcification.  Since patient was discharged he has minimal dyspnea on exertion.  No orthopnea, PND, pedal edema, exertional chest pain or syncope.  Some dizziness with standing at times.  He does have occasional palpitations.  The patient does not have symptoms concerning for COVID-19 infection (fever, chills, cough, or new shortness of breath).    Past Medical History:  Diagnosis Date  . Ascending aortic aneurysm ([place]) 09/04/2018  . Dyspnea 09/03/2018  . Elevated troponin 09/03/2018  . Gout   . Hypercholesteremia   . Hypertension   . Nephrolithiasis   . Palpitations 09/04/2018  . PE (pulmonary thromboembolism) ([place]) 09/04/2018  . Pulmonary embolism [facility])    Past  Surgical History:  Procedure Laterality Date  . No prior surgery       Current Meds  Medication Sig  . allopurinol (ZYLOPRIM) 100 MG tablet Take 200 mg by mouth daily.   Marland Kitchen apixaban (ELIQUIS) 5 MG TABS tablet Take 5 mg by mouth 2 (two) times daily.  . calcium carbonate (TUMS - DOSED IN MG ELEMENTAL CALCIUM) 500 MG chewable tablet Chew 1 tablet by mouth at bedtime as needed for indigestion or heartburn.  . carvedilol (COREG) 12.5 MG tablet Take 1 tablet (12.5 mg total) by mouth 2 (two) times daily with a meal.  . cloNIDine (CATAPRES) 0.3 MG tablet Take 0.3 mg by mouth daily.  . finasteride (PROSCAR) 5 MG tablet Take 5 mg by mouth daily.  Marland Kitchen losartan (COZAAR) 100 MG tablet Take 100 mg by mouth daily.  . rosuvastatin (CRESTOR) 40 MG tablet Take 1 tablet (40 mg total) by mouth daily at 6 PM.     Allergies:   Hctz [hydrochlorothiazide]   Social History   Tobacco Use  . Smoking status: Never Smoker  . Smokeless tobacco: Never Used  Substance Use Topics  . Alcohol use: Never    Frequency: Never  . Drug use: Never     Family Hx: The patient's family history includes CAD in his brother; Diabetes in his mother; Hypertension in his mother.  ROS:   Please see the history of present illness.    No F/C or productive cough; complains of palpitations; some residual weakness following recent discharge. All other systems reviewed and are negative.  Recent Labs: 09/03/2018: B Natriuretic Peptide  119.4 09/04/2018: ALT 29; Magnesium 2.0; TSH 3.783 09/05/2018: BUN 11; Creatinine, Ser 0.93; Hemoglobin 13.5; Platelets 215; Potassium 3.6; Sodium 138   Recent Lipid Panel Lab Results  Component Value Date/Time   CHOL 152 09/04/2018 04:12 AM   TRIG 51 09/04/2018 04:12 AM   HDL 46 09/04/2018 04:12 AM   CHOLHDL 3.3 09/04/2018 04:12 AM   LDLCALC 96 09/04/2018 04:12 AM    Wt Readings from Last 3 Encounters:  10/25/18 249 lb (112.9 kg)  09/05/18 256 lb 4.8 oz (116.3 kg)  01/15/18 255 lb (115.7 kg)      Objective:    Vital Signs:  BP (!) 141/80   Pulse (!) 50   Ht 5\' 9"  (1.753 m)   Wt 249 lb (112.9 kg)   BMI 36.77 kg/m    Normal affect.  Answers questions appropriately. Remainder of PE not performed (telehealth visit; corona virus pandemic)  ASSESSMENT & PLAN:    1. Recent pulmonary embolus-continue apixaban. No obvious precipitating factors. Will need fu with heme for hypercoaguable WU after apixaban DCed. 2. Coronary calcification-continue statin. Will arrange stress nuclear study in 3 months for risk stratification (after treated with apixaban for several months and after corona virus pandemic) 3. Palpitations-pt had these during recent hospitalization and telemetry without arrhythmia. Continue beta blocker.  If he continues to have symptoms will arrange event monitor. 4. Hypertension-blood pressure minimally elevated but he also has some orthostatic symptoms.  I asked him to stay well-hydrated.  We recently increased carvedilol.  Follow blood pressure and adjust regimen as needed. 5. Hyperlipidemia-continue crestor; check lipids and liver in 3 months. 6. TAA-FU CTA 2/21.   COVID-19 Education: The importance of social distancing was discussed today.  Time:   Today, I have spent 20 minutes with the patient with telehealth technology discussing the above problems.     Medication Adjustments/Labs and Tests Ordered: Current medicines are reviewed at length with the patient today.  Concerns regarding medicines are outlined above.   Tests Ordered: No orders of the defined types were placed in this encounter.   Medication Changes: No orders of the defined types were placed in this encounter.   Disposition:  Follow up 3 months  Signed, Kirk Ruths, MD  10/25/2018 1:13 PM    [place]

## 2018-10-25 ENCOUNTER — Telehealth: Payer: Self-pay | Admitting: Cardiology

## 2018-10-25 ENCOUNTER — Telehealth (INDEPENDENT_AMBULATORY_CARE_PROVIDER_SITE_OTHER): Payer: Medicare Other | Admitting: Cardiology

## 2018-10-25 ENCOUNTER — Encounter: Payer: Self-pay | Admitting: Cardiology

## 2018-10-25 ENCOUNTER — Encounter: Payer: Self-pay | Admitting: *Deleted

## 2018-10-25 VITALS — BP 141/80 | HR 50 | Ht 69.0 in | Wt 249.0 lb

## 2018-10-25 DIAGNOSIS — I7121 Aneurysm of the ascending aorta, without rupture: Secondary | ICD-10-CM

## 2018-10-25 DIAGNOSIS — I2699 Other pulmonary embolism without acute cor pulmonale: Secondary | ICD-10-CM

## 2018-10-25 DIAGNOSIS — I712 Thoracic aortic aneurysm, without rupture: Secondary | ICD-10-CM

## 2018-10-25 DIAGNOSIS — R0600 Dyspnea, unspecified: Secondary | ICD-10-CM | POA: Diagnosis not present

## 2018-10-25 DIAGNOSIS — E7800 Pure hypercholesterolemia, unspecified: Secondary | ICD-10-CM | POA: Diagnosis not present

## 2018-10-25 DIAGNOSIS — I2510 Atherosclerotic heart disease of native coronary artery without angina pectoris: Secondary | ICD-10-CM | POA: Diagnosis not present

## 2018-10-25 DIAGNOSIS — I10 Essential (primary) hypertension: Secondary | ICD-10-CM | POA: Diagnosis not present

## 2018-10-25 NOTE — Telephone Encounter (Signed)
Smart phone/No MyChart/pre reg complete. 10-25-18 ST Patient consents to phone visit. 10-25-18 ST

## 2018-10-25 NOTE — Patient Instructions (Signed)
Medication Instructions:  NO CHANGE If you need a refill on your cardiac medications before your next appointment, please call your pharmacy.   Lab work: Your physician recommends that you return for lab work in: [place] If you have labs (blood work) drawn today and your tests are completely normal, you will receive your results only by: Marland Kitchen MyChart Message (if you have MyChart) OR . A paper copy in the mail If you have any lab test that is abnormal or we need to change your treatment, we will call you to review the results.  Testing/Procedures: Your physician has requested that you have en exercise stress myoview. For further information please visit [email or web address]. Please follow instruction sheet, as given.  IN 3 MONTHS AT THE [place] OFF[facility]  Follow-Up: At [facility], you and your health needs are our priority.  As part of our continuing mission to provide you with exceptional heart care, we have created designated Provider Care Teams.  These Care Teams include your primary Cardiologist (physician) and Advanced Practice Providers (APPs -  Physician Assistants and Nurse Practitioners) who all work together to provide you with the care you need, when you need it. Your physician recommends that you schedule a follow-up appointment in: [place]

## 2019-01-03 NOTE — Telephone Encounter (Signed)
Opened in error

## 2019-01-21 NOTE — Progress Notes (Signed)
HPI: FU PAF and CAD. Pt admitted 2/20 with DOE; CTA showed bilateral subsegmental pulmonary emboli, dilated aortic root at 4.2 cm and coronary calcification. Troponin I 0.12. Echo showed normal LV function, G1DD, mild AI. Treated with apixaban. Also treated with statin for coronary calcification.   Nuclear study July 2020 showed normal perfusion and ejection fraction 52%.  Patient was noted to be in new onset atrial fibrillation.  Since last seen,  there is no dyspnea, chest pain or syncope.  He does feel occasional palpitations which presumably is his atrial fibrillation.  He has not had any bleeding on Eliquis.  Current Outpatient Medications  Medication Sig Dispense Refill  . allopurinol (ZYLOPRIM) 100 MG tablet Take 200 mg by mouth daily.   3  . apixaban (ELIQUIS) 5 MG TABS tablet Take 5 mg by mouth 2 (two) times daily.    . calcium carbonate (TUMS - DOSED IN MG ELEMENTAL CALCIUM) 500 MG chewable tablet Chew 1 tablet by mouth at bedtime as needed for indigestion or heartburn.    . carvedilol (COREG) 12.5 MG tablet Take 1 tablet (12.5 mg total) by mouth 2 (two) times daily with a meal. 180 tablet 3  . cloNIDine (CATAPRES) 0.3 MG tablet Take 0.3 mg by mouth daily.    . finasteride (PROSCAR) 5 MG tablet Take 5 mg by mouth daily.  2  . losartan (COZAAR) 100 MG tablet Take 100 mg by mouth daily.  5  . rosuvastatin (CRESTOR) 40 MG tablet Take 1 tablet (40 mg total) by mouth daily at 6 PM. 30 tablet 0   No current facility-administered medications for this visit.      Past Medical History:  Diagnosis Date  . Ascending aortic aneurysm ([place]) 09/04/2018  . Dyspnea 09/03/2018  . Elevated troponin 09/03/2018  . Gout   . Hypercholesteremia   . Hypertension   . Nephrolithiasis   . Palpitations 09/04/2018  . PE (pulmonary thromboembolism) ([place]) 09/04/2018  . Pulmonary embolism [facility])     Past Surgical History:  Procedure Laterality Date  . No prior surgery      Social History    Socioeconomic History  . Marital status: Married    Spouse name: Not on file  . Number of children: Not on file  . Years of education: Not on file  . Highest education level: Not on file  Occupational History  . Not on file  Social Needs  . Financial resource strain: Not on file  . Food insecurity    Worry: Not on file    Inability: Not on file  . Transportation needs    Medical: Not on file    Non-medical: Not on file  Tobacco Use  . Smoking status: Never Smoker  . Smokeless tobacco: Never Used  Substance and Sexual Activity  . Alcohol use: Never    Frequency: Never  . Drug use: Never  . Sexual activity: Not on file  Lifestyle  . Physical activity    Days per week: Not on file    Minutes per session: Not on file  . Stress: Not on file  Relationships  . Social Herbalist on phone: Not on file    Gets together: Not on file    Attends religious service: Not on file    Active member of club or organization: Not on file    Attends meetings of clubs or organizations: Not on file    Relationship status: Not on file  .  Intimate partner violence    Fear of current or ex partner: Not on file    Emotionally abused: Not on file    Physically abused: Not on file    Forced sexual activity: Not on file  Other Topics Concern  . Not on file  Social History Narrative  . Not on file    Family History  Problem Relation Age of Onset  . Diabetes Mother   . Hypertension Mother   . CAD Brother        CABG at age 104    ROS: no fevers or chills, productive cough, hemoptysis, dysphasia, odynophagia, melena, hematochezia, dysuria, hematuria, rash, seizure activity, orthopnea, PND, pedal edema, claudication. Remaining systems are negative.  Physical Exam: Well-developed well-nourished in no acute distress.  Skin is warm and dry.  HEENT is normal.  Neck is supple.  Chest is clear to auscultation with normal expansion.  Cardiovascular exam is regular rate and rhythm.   Abdominal exam nontender or distended. No masses palpated. Extremities show no edema. neuro grossly intact  ECG-sinus bradycardia at a rate of 52, no ST changes.  Personally reviewed  A/P  1 prior pulmonary embolus-plan to continue apixaban.  Patient had no obvious precipitating factors at time of the event.    2 coronary calcification-continue statin.  No aspirin given need for apixaban.  Nuclear study showed no ischemia.  3 hypertension-patient's blood pressure is controlled.  Continue present medications and follow.  4 hyperlipidemia-continue statin.  5 thoracic aortic aneurysm-follow-up CTA February 2021.  6 atrial fibrillation-newly diagnosed.  He is now back in sinus rhythm.  Continue carvedilol for rate control.  Continue apixaban.  Long discussion today concerning rate control versus rhythm control.  He does have some palpitations but is otherwise asymptomatic.  I would like to try rate control for now but may need to consider antiarrhythmic in the future.  Kirk Ruths, MD

## 2019-01-23 ENCOUNTER — Telehealth (HOSPITAL_COMMUNITY): Payer: Self-pay

## 2019-01-23 NOTE — Telephone Encounter (Signed)
Encounter complete. 

## 2019-01-24 ENCOUNTER — Telehealth (HOSPITAL_COMMUNITY): Payer: Self-pay

## 2019-01-24 NOTE — Telephone Encounter (Signed)
Encounter complete. 

## 2019-01-25 ENCOUNTER — Ambulatory Visit (HOSPITAL_COMMUNITY)
Admission: RE | Admit: 2019-01-25 | Discharge: 2019-01-25 | Disposition: A | Discharge status: Home / Self Care | Payer: Medicare Other | Source: Ambulatory Visit | Attending: Cardiology | Admitting: Cardiology

## 2019-01-25 ENCOUNTER — Other Ambulatory Visit: Payer: Self-pay

## 2019-01-25 DIAGNOSIS — R0600 Dyspnea, unspecified: Secondary | ICD-10-CM | POA: Insufficient documentation

## 2019-01-25 DIAGNOSIS — I2510 Atherosclerotic heart disease of native coronary artery without angina pectoris: Secondary | ICD-10-CM | POA: Insufficient documentation

## 2019-01-25 MED ORDER — TECHNETIUM TC 99M TETROFOSMIN IV KIT
32.0000 | PACK | Freq: Once | INTRAVENOUS | Status: AC | PRN
  Administered 2019-01-25: 32 via INTRAVENOUS
  Filled 2019-01-25: qty 32

## 2019-01-25 MED ORDER — TECHNETIUM TC 99M TETROFOSMIN IV KIT
9.9000 | PACK | Freq: Once | INTRAVENOUS | Status: AC | PRN
  Administered 2019-01-25: 9.9 via INTRAVENOUS
  Filled 2019-01-25: qty 10

## 2019-01-25 MED ORDER — REGADENOSON 0.4 MG/5ML IV SOLN
0.4000 mg | Freq: Once | INTRAVENOUS | Status: AC
  Administered 2019-01-25: 0.4 mg via INTRAVENOUS

## 2019-01-28 LAB — MYOCARDIAL PERFUSION IMAGING
LV dias vol: 120 mL (ref 62–150)
LV sys vol: 57 mL
Peak HR: 96 {beats}/min
Rest HR: 83 {beats}/min
SDS: 0
SRS: 0
SSS: 0
TID: 0.98

## 2019-01-29 ENCOUNTER — Ambulatory Visit (INDEPENDENT_AMBULATORY_CARE_PROVIDER_SITE_OTHER): Payer: Medicare Other | Admitting: Cardiology

## 2019-01-29 ENCOUNTER — Other Ambulatory Visit: Payer: Self-pay

## 2019-01-29 ENCOUNTER — Encounter: Payer: Self-pay | Admitting: Cardiology

## 2019-01-29 VITALS — BP 140/80 | HR 51 | Temp 97.9°F | Ht 69.0 in | Wt 247.0 lb

## 2019-01-29 DIAGNOSIS — I2510 Atherosclerotic heart disease of native coronary artery without angina pectoris: Secondary | ICD-10-CM | POA: Diagnosis not present

## 2019-01-29 DIAGNOSIS — E7800 Pure hypercholesterolemia, unspecified: Secondary | ICD-10-CM | POA: Diagnosis not present

## 2019-01-29 DIAGNOSIS — I480 Paroxysmal atrial fibrillation: Secondary | ICD-10-CM | POA: Diagnosis not present

## 2019-01-29 DIAGNOSIS — I7121 Aneurysm of the ascending aorta, without rupture: Secondary | ICD-10-CM

## 2019-01-29 DIAGNOSIS — I10 Essential (primary) hypertension: Secondary | ICD-10-CM | POA: Diagnosis not present

## 2019-01-29 DIAGNOSIS — I712 Thoracic aortic aneurysm, without rupture: Secondary | ICD-10-CM

## 2019-01-29 NOTE — Patient Instructions (Signed)
Medication Instructions:  NO CHANGE If you need a refill on your cardiac medications before your next appointment, please call your pharmacy.   Lab work: If you have labs (blood work) drawn today and your tests are completely normal, you will receive your results only by: Marland Kitchen MyChart Message (if you have MyChart) OR . A paper copy in the mail If you have any lab test that is abnormal or we need to change your treatment, we will call you to review the results.  Follow-Up: At [facility], you and your health needs are our priority.  As part of our continuing mission to provide you with exceptional heart care, we have created designated Provider Care Teams.  These Care Teams include your primary Cardiologist (physician) and Advanced Practice Providers (APPs -  Physician Assistants and Nurse Practitioners) who all work together to provide you with the care you need, when you need it. . Your physician recommends that you schedule a follow-up appointment in: [place] OFFICE VISIT

## 2019-05-27 NOTE — Progress Notes (Signed)
HPI: FU PAF and CAD. Pt admitted 2/20 with DOE; CTA showed bilateral subsegmental pulmonary emboli, dilated aortic root at 4.2 cm and coronary calcification.Troponin I 0.12. Echo showed normal LV function, G1DD, mild AI. Treated with apixaban. Also treated with statin for coronary calcification.  Nuclear study July 2020 showed normal perfusion and ejection fraction 52%.  Patient was noted to be in new onset atrial fibrillation.  Since last seen,  patient denies dyspnea or chest pain.  He is having occasional "pounding sensation in his chest".  It is described as a strong heartbeat.  Occasional dizziness but no syncope.  He is also concerned about his heart rate.  He states it was in the 40s and he decreased his carvedilol to 12.5 mg daily transiently.  He also states he feels poorly with his heart rate in the 90s.  Current Outpatient Medications  Medication Sig Dispense Refill  . allopurinol (ZYLOPRIM) 100 MG tablet Take 200 mg by mouth daily.   3  . apixaban (ELIQUIS) 5 MG TABS tablet Take 5 mg by mouth 2 (two) times daily.    . calcium carbonate (TUMS - DOSED IN MG ELEMENTAL CALCIUM) 500 MG chewable tablet Chew 1 tablet by mouth at bedtime as needed for indigestion or heartburn.    . carvedilol (COREG) 12.5 MG tablet Take 1 tablet (12.5 mg total) by mouth 2 (two) times daily with a meal. 180 tablet 3  . cloNIDine (CATAPRES) 0.3 MG tablet Take 0.3 mg by mouth daily.    . finasteride (PROSCAR) 5 MG tablet Take 5 mg by mouth daily.  2  . losartan (COZAAR) 100 MG tablet Take 100 mg by mouth daily.  5  . rosuvastatin (CRESTOR) 40 MG tablet Take 1 tablet (40 mg total) by mouth daily at 6 PM. 30 tablet 0   No current facility-administered medications for this visit.      Past Medical History:  Diagnosis Date  . Ascending aortic aneurysm ([place]) 09/04/2018  . Dyspnea 09/03/2018  . Elevated troponin 09/03/2018  . Gout   . Hypercholesteremia   . Hypertension   . Nephrolithiasis   .  Palpitations 09/04/2018  . PE (pulmonary thromboembolism) ([place]) 09/04/2018  . Pulmonary embolism [facility])     Past Surgical History:  Procedure Laterality Date  . No prior surgery      Social History   Socioeconomic History  . Marital status: Married    Spouse name: Not on file  . Number of children: Not on file  . Years of education: Not on file  . Highest education level: Not on file  Occupational History  . Not on file  Social Needs  . Financial resource strain: Not on file  . Food insecurity    Worry: Not on file    Inability: Not on file  . Transportation needs    Medical: Not on file    Non-medical: Not on file  Tobacco Use  . Smoking status: Never Smoker  . Smokeless tobacco: Never Used  Substance and Sexual Activity  . Alcohol use: Never    Frequency: Never  . Drug use: Never  . Sexual activity: Not on file  Lifestyle  . Physical activity    Days per week: Not on file    Minutes per session: Not on file  . Stress: Not on file  Relationships  . Social Herbalist on phone: Not on file    Gets together: Not on file  Attends religious service: Not on file    Active member of club or organization: Not on file    Attends meetings of clubs or organizations: Not on file    Relationship status: Not on file  . Intimate partner violence    Fear of current or ex partner: Not on file    Emotionally abused: Not on file    Physically abused: Not on file    Forced sexual activity: Not on file  Other Topics Concern  . Not on file  Social History Narrative  . Not on file    Family History  Problem Relation Age of Onset  . Diabetes Mother   . Hypertension Mother   . CAD Brother        CABG at age 23    ROS: no fevers or chills, productive cough, hemoptysis, dysphasia, odynophagia, melena, hematochezia, dysuria, hematuria, rash, seizure activity, orthopnea, PND, pedal edema, claudication. Remaining systems are negative.  Physical Exam: Well-developed  well-nourished in no acute distress.  Skin is warm and dry.  HEENT is normal.  Neck is supple.  Chest is clear to auscultation with normal expansion.  Cardiovascular exam is regular rate and rhythm.  Abdominal exam nontender or distended. No masses palpated. Extremities show no edema. neuro grossly intact  ECG-bradycardia at a rate of 54, no ST changes.  Personally reviewed  A/P  1 paroxysmal atrial fibrillation-plan to continue beta-blocker for rate control if atrial fibrillation recurs.  Continue apixaban.  2 hypertension-blood pressure is elevated.  Add amlodipine 5 mg daily and follow.  3 hyperlipidemia-continue statin.  4 thoracic aortic aneurysm-follow-up CTA February 2021.  5 Coronary calcification-previous nuclear study showed no ischemia.  We will continue with medical therapy.  Continue statin.  He is not on aspirin given need for anticoagulation.  6 prior pulmonary embolus-continue apixaban.  Patient did not have precipitating factors at time of previous event.  7 palpitations-patient is having a pounding sensation in his chest and also is concerned about his heart rate.  We will check 24-hour Holter monitor to rule out significant arrhythmia as he is having his symptoms daily.  We will adjust carvedilol as needed.  Kirk Ruths, MD

## 2019-05-30 ENCOUNTER — Other Ambulatory Visit: Payer: Self-pay

## 2019-05-30 ENCOUNTER — Ambulatory Visit: Payer: Medicare Other | Admitting: Cardiology

## 2019-05-30 ENCOUNTER — Encounter: Payer: Self-pay | Admitting: Cardiology

## 2019-05-30 VITALS — BP 168/100 | HR 85 | Temp 98.4°F | Ht 69.0 in | Wt 252.0 lb

## 2019-05-30 DIAGNOSIS — R002 Palpitations: Secondary | ICD-10-CM

## 2019-05-30 DIAGNOSIS — I10 Essential (primary) hypertension: Secondary | ICD-10-CM | POA: Diagnosis not present

## 2019-05-30 DIAGNOSIS — I480 Paroxysmal atrial fibrillation: Secondary | ICD-10-CM

## 2019-05-30 DIAGNOSIS — I2510 Atherosclerotic heart disease of native coronary artery without angina pectoris: Secondary | ICD-10-CM | POA: Diagnosis not present

## 2019-05-30 MED ORDER — AMLODIPINE BESYLATE 5 MG PO TABS: 5.0000 mg | ORAL_TABLET | Freq: Every day | ORAL | 3 refills | Status: DC

## 2019-05-30 NOTE — Patient Instructions (Signed)
Medication Instructions:  AMLODIPINE 5 MG ONCE DAILY  *If you need a refill on your cardiac medications before your next appointment, please call your pharmacy*  Lab Work: If you have labs (blood work) drawn today and your tests are completely normal, you will receive your results only by: Marland Kitchen MyChart Message (if you have MyChart) OR . A paper copy in the mail If you have any lab test that is abnormal or we need to change your treatment, we will call you to review the results.  Testing/Procedures: [place]- Long Term Monitor Instructions   Your physician has requested you wear your ZIO patch monitor 24 HOURS.   This is a single patch monitor.  [employer] supplies one patch monitor per enrollment.  Additional stickers are not available.   Please do not apply patch if you will be having a Nuclear Stress Test, Echocardiogram, Cardiac CT, MRI, or Chest Xray during the time frame you would be wearing the monitor. The patch cannot be worn during these tests.  You cannot remove and re-apply the ZIO XT patch monitor.   Your ZIO patch monitor will be sent USPS Priority mail from [facility] directly to your home address. The monitor may also be mailed to a PO BOX if home delivery is not available.   It may take 3-5 days to receive your monitor after you have been enrolled.   Once you have received you monitor, please review enclosed instructions.  Your monitor has already been registered assigning a specific monitor serial # to you.   Applying the monitor   Shave hair from upper left chest.   Hold abrader disc by orange tab.  Rub abrader in 40 strokes over left upper chest as indicated in your monitor instructions.   Clean area with 4 enclosed alcohol pads .  Use all pads to assure are is cleaned thoroughly.  Let dry.   Apply patch as indicated in monitor instructions.  Patch will be place under collarbone on left side of chest with arrow pointing upward.   Rub patch adhesive wings for 2  minutes.Remove white label marked "1".  Remove white label marked "2".  Rub patch adhesive wings for 2 additional minutes.   While looking in a mirror, press and release button in center of patch.  A small green light will flash 3-4 times .  This will be your only indicator the monitor has been turned on.     Do not shower for the first 24 hours.  You may shower after the first 24 hours.   Press button if you feel a symptom. You will hear a small click.  Record Date, Time and Symptom in the Patient Log Book.   When you are ready to remove patch, follow instructions on last 2 pages of Patient Log Book.  Stick patch monitor onto last page of Patient Log Book.   Place Patient Log Book in [place] [employer] [place] box.  Use locking tab on box and tape box closed securely.  The [employer] has [employer] on it.  Please place in mailbox as soon as possible.  Your physician should have your test results approximately 7 days after the monitor has been mailed back to [facility].   Call [place] at [phone] if you have questions regarding your ZIO XT patch monitor.  Call them immediately if you see an orange light blinking on your monitor.   If your monitor falls off in less than 4 days  contact our Monitor department at [phone].  If your monitor becomes loose or falls off after 4 days call [employer] at [phone] for suggestions on securing your monitor.    Follow-Up: At [facility], you and your health needs are our priority.  As part of our continuing mission to provide you with exceptional heart care, we have created designated Provider Care Teams.  These Care Teams include your primary Cardiologist (physician) and Advanced Practice Providers (APPs -  Physician Assistants and Nurse Practitioners) who all work together to provide you with the care you need, when you need it.  Your next appointment:   3 month(s)  The format for your next appointment:   In  Person  Provider:   Kirk Ruths, MD

## 2019-06-05 ENCOUNTER — Telehealth: Payer: Self-pay

## 2019-06-05 NOTE — Telephone Encounter (Signed)
3 day ZIO XT ordered and mailed to pt.

## 2019-07-13 ENCOUNTER — Other Ambulatory Visit (INDEPENDENT_AMBULATORY_CARE_PROVIDER_SITE_OTHER): Payer: Medicare Other

## 2019-07-13 DIAGNOSIS — R002 Palpitations: Secondary | ICD-10-CM

## 2019-08-21 NOTE — Progress Notes (Deleted)
HPI: FU PAF and CAD.Pt admitted 2/20 with DOE; CTA showed bilateral subsegmental pulmonary emboli, dilated aortic root at 4.2 cm and coronary calcification.Troponin I 0.12. Echo showed normal LV function, G1DD, mild AI. Treated with apixaban. Also treated with statin for coronary calcification.Nuclear study July 2020 showed normal perfusion and ejection fraction 52%. Patient was noted to be in new onset atrial fibrillation. Monitor January 2021 showed sinus bradycardia, normal sinus rhythm, sinus tachycardia and occasional PAC/PVC.  Sincelast seen,  Current Outpatient Medications  Medication Sig Dispense Refill  . allopurinol (ZYLOPRIM) 100 MG tablet Take 200 mg by mouth daily.   3  . amLODipine (NORVASC) 5 MG tablet Take 1 tablet (5 mg total) by mouth daily. 90 tablet 3  . apixaban (ELIQUIS) 5 MG TABS tablet Take 5 mg by mouth 2 (two) times daily.    . calcium carbonate (TUMS - DOSED IN MG ELEMENTAL CALCIUM) 500 MG chewable tablet Chew 1 tablet by mouth at bedtime as needed for indigestion or heartburn.    . carvedilol (COREG) 12.5 MG tablet Take 1 tablet (12.5 mg total) by mouth 2 (two) times daily with a meal. 180 tablet 3  . cloNIDine (CATAPRES) 0.3 MG tablet Take 0.3 mg by mouth daily.    . finasteride (PROSCAR) 5 MG tablet Take 5 mg by mouth daily.  2  . losartan (COZAAR) 100 MG tablet Take 100 mg by mouth daily.  5  . rosuvastatin (CRESTOR) 40 MG tablet Take 1 tablet (40 mg total) by mouth daily at 6 PM. 30 tablet 0   No current facility-administered medications for this visit.     Past Medical History:  Diagnosis Date  . Ascending aortic aneurysm ([place]) 09/04/2018  . Dyspnea 09/03/2018  . Elevated troponin 09/03/2018  . Gout   . Hypercholesteremia   . Hypertension   . Nephrolithiasis   . Palpitations 09/04/2018  . PE (pulmonary thromboembolism) ([place]) 09/04/2018  . Pulmonary embolism [facility])     Past Surgical History:  Procedure Laterality Date  . No prior surgery       Social History   Socioeconomic History  . Marital status: Married    Spouse name: Not on file  . Number of children: Not on file  . Years of education: Not on file  . Highest education level: Not on file  Occupational History  . Not on file  Tobacco Use  . Smoking status: Never Smoker  . Smokeless tobacco: Never Used  Substance and Sexual Activity  . Alcohol use: Never  . Drug use: Never  . Sexual activity: Not on file  Other Topics Concern  . Not on file  Social History Narrative  . Not on file   Social Determinants of Health   Financial Resource Strain:   . Difficulty of Paying Living Expenses: Not on file  Food Insecurity:   . Worried About Charity fundraiser in the Last Year: Not on file  . Ran Out of Food in the Last Year: Not on file  Transportation Needs:   . Lack of Transportation (Medical): Not on file  . Lack of Transportation (Non-Medical): Not on file  Physical Activity:   . Days of Exercise per Week: Not on file  . Minutes of Exercise per Session: Not on file  Stress:   . Feeling of Stress : Not on file  Social Connections:   . Frequency of Communication with Friends and Family: Not on file  . Frequency of Social Gatherings with  Friends and Family: Not on file  . Attends Religious Services: Not on file  . Active Member of Clubs or Organizations: Not on file  . Attends Archivist Meetings: Not on file  . Marital Status: Not on file  Intimate Partner Violence:   . Fear of Current or Ex-Partner: Not on file  . Emotionally Abused: Not on file  . Physically Abused: Not on file  . Sexually Abused: Not on file    Family History  Problem Relation Age of Onset  . Diabetes Mother   . Hypertension Mother   . CAD Brother        CABG at age 50    ROS: no fevers or chills, productive cough, hemoptysis, dysphasia, odynophagia, melena, hematochezia, dysuria, hematuria, rash, seizure activity, orthopnea, PND, pedal edema, claudication.  Remaining systems are negative.  Physical Exam: Well-developed well-nourished in no acute distress.  Skin is warm and dry.  HEENT is normal.  Neck is supple.  Chest is clear to auscultation with normal expansion.  Cardiovascular exam is regular rate and rhythm.  Abdominal exam nontender or distended. No masses palpated. Extremities show no edema. neuro grossly intact  ECG- personally reviewed  A/P  1 paroxysmal atrial fibrillation-continue apixaban.  Continue beta-blocker.  2 hypertension-blood pressure controlled.  Continue present medications and follow.  3 hyperlipidemia-continue statin.  4 thoracic aortic aneurysm-we will arrange follow-up CTA.  5 coronary calcification-patient is not having chest pain and previous nuclear study showed no ischemia.  Continue statin.  No aspirin given need for anticoagulation.  6 previous pulmonary embolus-patient did not have precipitating factors at time of previous event.  He will continue on apixaban both for atrial fibrillation and prior pulmonary embolus.  7 palpitations-continue beta-blocker.  No significant arrhythmias on recent monitor.  Kirk Ruths, MD

## 2019-08-30 ENCOUNTER — Ambulatory Visit: Payer: Medicare Other | Admitting: Cardiology

## 2019-09-12 NOTE — Progress Notes (Signed)
HPI: FU PAF and CAD.Pt admitted 2/20 with DOE; CTA showed bilateral subsegmental pulmonary emboli, dilated aortic root at 4.2 cm and coronary calcification.Troponin I 0.12. Echo showed normal LV function, G1DD, mild AI. Treated with apixaban. Also treated with statin for coronary calcification.Nuclear study July 2020 showed normal perfusion and ejection fraction 52%. Patient was noted to be in new onset atrial fibrillation. Monitor January 2021 showed sinus rhythm with PACs and PVCs.  Sincelast seen,there is no dyspnea, chest pain or syncope.  Occasional palpitations.  Current Outpatient Medications  Medication Sig Dispense Refill  . allopurinol (ZYLOPRIM) 100 MG tablet Take 200 mg by mouth daily.   3  . amLODipine (NORVASC) 5 MG tablet Take 1 tablet (5 mg total) by mouth daily. 90 tablet 3  . apixaban (ELIQUIS) 5 MG TABS tablet Take 5 mg by mouth 2 (two) times daily.    . calcium carbonate (TUMS - DOSED IN MG ELEMENTAL CALCIUM) 500 MG chewable tablet Chew 1 tablet by mouth at bedtime as needed for indigestion or heartburn.    . carvedilol (COREG) 12.5 MG tablet Take 1 tablet (12.5 mg total) by mouth 2 (two) times daily with a meal. 180 tablet 3  . cloNIDine (CATAPRES) 0.3 MG tablet Take 0.3 mg by mouth daily.    . finasteride (PROSCAR) 5 MG tablet Take 5 mg by mouth daily.  2  . losartan (COZAAR) 100 MG tablet Take 100 mg by mouth daily.  5  . rosuvastatin (CRESTOR) 40 MG tablet Take 1 tablet (40 mg total) by mouth daily at 6 PM. 30 tablet 0   No current facility-administered medications for this visit.     Past Medical History:  Diagnosis Date  . Ascending aortic aneurysm ([place]) 09/04/2018  . Dyspnea 09/03/2018  . Elevated troponin 09/03/2018  . Gout   . Hypercholesteremia   . Hypertension   . Nephrolithiasis   . Palpitations 09/04/2018  . PE (pulmonary thromboembolism) ([place]) 09/04/2018  . Pulmonary embolism [facility])     Past Surgical History:  Procedure Laterality Date  .  No prior surgery      Social History   Socioeconomic History  . Marital status: Married    Spouse name: Not on file  . Number of children: Not on file  . Years of education: Not on file  . Highest education level: Not on file  Occupational History  . Not on file  Tobacco Use  . Smoking status: Never Smoker  . Smokeless tobacco: Never Used  Substance and Sexual Activity  . Alcohol use: Never  . Drug use: Never  . Sexual activity: Not on file  Other Topics Concern  . Not on file  Social History Narrative  . Not on file   Social Determinants of Health   Financial Resource Strain:   . Difficulty of Paying Living Expenses:   Food Insecurity:   . Worried About Charity fundraiser in the Last Year:   . Arboriculturist in the Last Year:   Transportation Needs:   . Film/video editor (Medical):   Marland Kitchen Lack of Transportation (Non-Medical):   Physical Activity:   . Days of Exercise per Week:   . Minutes of Exercise per Session:   Stress:   . Feeling of Stress :   Social Connections:   . Frequency of Communication with Friends and Family:   . Frequency of Social Gatherings with Friends and Family:   . Attends Religious Services:   .  Active Member of Clubs or Organizations:   . Attends Archivist Meetings:   Marland Kitchen Marital Status:   Intimate Partner Violence:   . Fear of Current or Ex-Partner:   . Emotionally Abused:   Marland Kitchen Physically Abused:   . Sexually Abused:     Family History  Problem Relation Age of Onset  . Diabetes Mother   . Hypertension Mother   . CAD Brother        CABG at age 50    ROS: no fevers or chills, productive cough, hemoptysis, dysphasia, odynophagia, melena, hematochezia, dysuria, hematuria, rash, seizure activity, orthopnea, PND, pedal edema, claudication. Remaining systems are negative.  Physical Exam: Well-developed well-nourished in no acute distress.  Skin is warm and dry.  HEENT is normal.  Neck is supple.  Chest is clear to  auscultation with normal expansion.  Cardiovascular exam is regular rate and rhythm.  Abdominal exam nontender or distended. No masses palpated. Extremities show no edema. neuro grossly intact  A/P  1 paroxysmal atrial fibrillation-patient remains in sinus rhythm on examination today.  Continue beta-blocker and apixaban.  2 hypertension-blood pressure elevated; increase amlodipine to 10 mg daily and follow.  3 thoracic aortic aneurysm-we will arrange follow-up CTA to further assess.  4 hyperlipidemia-continue statin.  Lipids and liver.  5 coronary calcification-continue statin.  He is not on aspirin given need for apixaban.  6 prior pulmonary embolus-we will continue apixaban.  He did not have a precipitating cause at time of previous pulmonary embolus.  7 palpitations-recent monitor showed PACs and PVCs.  Continue beta-blocker.  Kirk Ruths, MD

## 2019-09-19 ENCOUNTER — Other Ambulatory Visit: Payer: Self-pay

## 2019-09-19 ENCOUNTER — Encounter: Payer: Self-pay | Admitting: Cardiology

## 2019-09-19 ENCOUNTER — Ambulatory Visit: Payer: Medicare Other | Admitting: Cardiology

## 2019-09-19 VITALS — BP 148/80 | HR 90 | Temp 97.7°F | Ht 69.0 in | Wt 269.0 lb

## 2019-09-19 DIAGNOSIS — R002 Palpitations: Secondary | ICD-10-CM

## 2019-09-19 DIAGNOSIS — I712 Thoracic aortic aneurysm, without rupture, unspecified: Secondary | ICD-10-CM

## 2019-09-19 DIAGNOSIS — I2699 Other pulmonary embolism without acute cor pulmonale: Secondary | ICD-10-CM

## 2019-09-19 MED ORDER — AMLODIPINE BESYLATE 10 MG PO TABS: 10.0000 mg | ORAL_TABLET | Freq: Every day | ORAL | 3 refills | Status: DC

## 2019-09-19 NOTE — Patient Instructions (Signed)
Medication Instructions:  INCREASE AMLODIPINE TO 10 MG ONCE DAILY= 2 OF THE 5 MG TABLETS ONCE DAILY  *If you need a refill on your cardiac medications before your next appointment, please call your pharmacy*   Lab Work: If you have labs (blood work) drawn today and your tests are completely normal, you will receive your results only by: Marland Kitchen MyChart Message (if you have MyChart) OR . A paper copy in the mail If you have any lab test that is abnormal or we need to change your treatment, we will call you to review the results.   Testing/Procedures: CTA OF THE CHEST W/WO TO FOLLOW UP THORACIC [id number] [facility]   Follow-Up: At [facility], you and your health needs are our priority.  As part of our continuing mission to provide you with exceptional heart care, we have created designated Provider Care Teams.  These Care Teams include your primary Cardiologist (physician) and Advanced Practice Providers (APPs -  Physician Assistants and Nurse Practitioners) who all work together to provide you with the care you need, when you need it.  We recommend signing up for the patient portal called "MyChart".  Sign up information is provided on this After Visit Summary.  MyChart is used to connect with patients for Virtual Visits (Telemedicine).  Patients are able to view lab/test results, encounter notes, upcoming appointments, etc.  Non-urgent messages can be sent to your provider as well.   To learn more about what you can do with MyChart, go to [email or web address].    Your next appointment:   6 month(s)  The format for your next appointment:   Either In Person or Virtual  Provider:   You may see Kirk Ruths, MD or one of the following Advanced Practice Providers on your designated Care Team:    Kerin Ransom, PA-C  [place], [place]  Coletta Memos, [place]

## 2019-10-02 ENCOUNTER — Ambulatory Visit
Admission: RE | Admit: 2019-10-02 | Discharge: 2019-10-02 | Disposition: A | Discharge status: Home / Self Care | Payer: Medicare Other | Source: Ambulatory Visit | Attending: Cardiology | Admitting: Cardiology

## 2019-10-02 DIAGNOSIS — I712 Thoracic aortic aneurysm, without rupture, unspecified: Secondary | ICD-10-CM

## 2019-10-02 MED ORDER — IOPAMIDOL (ISOVUE-370) INJECTION 76%
75.0000 mL | Freq: Once | INTRAVENOUS | Status: AC | PRN
  Administered 2019-10-02: 75 mL via INTRAVENOUS

## 2019-10-03 ENCOUNTER — Telehealth: Payer: Self-pay | Admitting: Cardiology

## 2019-10-03 NOTE — Telephone Encounter (Signed)
Diane from [facility] calling to report a CT.

## 2019-10-03 NOTE — Telephone Encounter (Signed)
Returned call to Diane with [facility], has already given report to triage nurse.       Dr. Stanford Breed is aware and already reviewed CT.

## 2019-10-09 ENCOUNTER — Telehealth: Payer: Self-pay | Admitting: *Deleted

## 2019-10-09 NOTE — Telephone Encounter (Signed)
Patient with diagnosis of atrial fibrillation and history of PE on 09/03/18 on Eliquis for anticoagulation.    Procedure: Colonoscopy with Propofol Date of procedure: 10/15/19  CHADS2-VASc score of 3 (HTN, AGE, CAD)  CrCl 131 ml/min using actual body weight (122 kg), 98.3 ml/min using adjusted body weight (91.4 kg) Platelet count 215  Per office protocol, patient can hold Eliquis for 1 day prior to procedure.

## 2019-10-09 NOTE — Telephone Encounter (Signed)
Routed to pharmacy for recommendations.

## 2019-10-09 NOTE — Telephone Encounter (Signed)
   [facility] Pre-operative Risk Assessment    Request for surgical clearance:  1. What type of surgery is being performed? Colonoscopy with Propofol   2. When is this surgery scheduled? 10/15/2019   3. What type of clearance is required (medical clearance vs. Pharmacy clearance to hold med vs. Both)? Medical  4. Are there any medications that need to be held prior to surgery and how long? Eliquis   5. Practice name and name of physician performing surgery? [facility] [place]   6. What is your office phone number [phone]    7.   What is your office fax number [phone]  8.   Anesthesia type (None, local, MAC, general) ? Georgiann Hahn 10/09/2019, 12:14 PM  _________________________________________________________________   (provider comments below)

## 2019-10-09 NOTE — Telephone Encounter (Signed)
   Primary Cardiologist: Kirk Ruths, MD  Chart reviewed as part of pre-operative protocol coverage. Given past medical history and time since last visit, based on ACC/AHA guidelines, Kenneth [name] would be at acceptable risk for the planned procedure without further cardiovascular testing.   I will route this recommendation to the requesting party via Epic fax function and remove from pre-op pool.   Per Pharmacy: Patient with diagnosis of atrial fibrillation and history of PE on 09/03/18 on Eliquis for anticoagulation.    Procedure: Colonoscopy with Propofol Date of procedure: 10/15/19  CHADS2-VASc score of 3 (HTN, AGE, CAD)  CrCl 131 ml/min using actual body weight (122 kg), 98.3 ml/min using adjusted body weight (91.4 kg) Platelet count 215  Per office protocol, patient can hold Eliquis for 1 day prior to procedure.    Please call with questions.  Phill Myron. [place] Pugh, ANP, AACC  10/09/2019, 2:05 PM

## 2019-10-17 ENCOUNTER — Other Ambulatory Visit: Payer: Self-pay | Admitting: Cardiology

## 2019-10-22 ENCOUNTER — Other Ambulatory Visit: Payer: Self-pay | Admitting: Internal Medicine

## 2019-10-22 DIAGNOSIS — R590 Localized enlarged lymph nodes: Secondary | ICD-10-CM

## 2019-10-24 ENCOUNTER — Other Ambulatory Visit: Payer: Self-pay | Admitting: Internal Medicine

## 2019-10-24 DIAGNOSIS — R590 Localized enlarged lymph nodes: Secondary | ICD-10-CM

## 2019-11-07 ENCOUNTER — Other Ambulatory Visit: Payer: Self-pay

## 2019-11-07 ENCOUNTER — Ambulatory Visit
Admission: RE | Admit: 2019-11-07 | Discharge: 2019-11-07 | Disposition: A | Discharge status: Home / Self Care | Payer: Medicare Other | Source: Ambulatory Visit | Attending: Internal Medicine | Admitting: Internal Medicine

## 2019-11-07 DIAGNOSIS — R590 Localized enlarged lymph nodes: Secondary | ICD-10-CM

## 2019-11-07 MED ORDER — IOPAMIDOL (ISOVUE-300) INJECTION 61%
100.0000 mL | Freq: Once | INTRAVENOUS | Status: AC | PRN
  Administered 2019-11-07: 100 mL via INTRAVENOUS

## 2019-11-13 ENCOUNTER — Telehealth: Payer: Self-pay | Admitting: Hematology

## 2019-11-13 NOTE — Telephone Encounter (Signed)
Received a new pt referral from Dr. Adela Ports office for lymphadenopathy. Mr. Shriber cld and scheduled an appt w/Dr. Irene Limbo on 5/11 at 11am. He's been made aware to arrive 15 minutes early.

## 2019-11-19 ENCOUNTER — Inpatient Hospital Stay: Payer: Medicare Other | Attending: Hematology | Admitting: Hematology

## 2019-11-19 ENCOUNTER — Other Ambulatory Visit: Payer: Self-pay

## 2019-11-19 ENCOUNTER — Inpatient Hospital Stay: Payer: Medicare Other

## 2019-11-19 VITALS — BP 154/87 | HR 90 | Temp 98.7°F | Resp 18 | Ht 69.0 in | Wt 266.4 lb

## 2019-11-19 DIAGNOSIS — N200 Calculus of kidney: Secondary | ICD-10-CM | POA: Diagnosis not present

## 2019-11-19 DIAGNOSIS — R591 Generalized enlarged lymph nodes: Secondary | ICD-10-CM | POA: Diagnosis not present

## 2019-11-19 DIAGNOSIS — K148 Other diseases of tongue: Secondary | ICD-10-CM | POA: Insufficient documentation

## 2019-11-19 DIAGNOSIS — D479 Neoplasm of uncertain behavior of lymphoid, hematopoietic and related tissue, unspecified: Secondary | ICD-10-CM

## 2019-11-19 DIAGNOSIS — D3502 Benign neoplasm of left adrenal gland: Secondary | ICD-10-CM | POA: Insufficient documentation

## 2019-11-19 LAB — CBC WITH DIFFERENTIAL/PLATELET
Abs Immature Granulocytes: 0.02 10*3/uL (ref 0.00–0.07)
Basophils Absolute: 0.1 10*3/uL (ref 0.0–0.1)
Basophils Relative: 1 %
Eosinophils Absolute: 0.3 10*3/uL (ref 0.0–0.5)
Eosinophils Relative: 5 %
HCT: 43.1 % (ref 39.0–52.0)
Hemoglobin: 14.3 g/dL (ref 13.0–17.0)
Immature Granulocytes: 0 %
Lymphocytes Relative: 24 %
Lymphs Abs: 1.5 10*3/uL (ref 0.7–4.0)
MCH: 28.3 pg (ref 26.0–34.0)
MCHC: 33.2 g/dL (ref 30.0–36.0)
MCV: 85.3 fL (ref 80.0–100.0)
Monocytes Absolute: 0.7 10*3/uL (ref 0.1–1.0)
Monocytes Relative: 10 %
Neutro Abs: 3.8 10*3/uL (ref 1.7–7.7)
Neutrophils Relative %: 60 %
Platelets: 212 10*3/uL (ref 150–400)
RBC: 5.05 MIL/uL (ref 4.22–5.81)
RDW: 13.2 % (ref 11.5–15.5)
WBC: 6.3 10*3/uL (ref 4.0–10.5)
nRBC: 0 % (ref 0.0–0.2)

## 2019-11-19 LAB — CMP (CANCER CENTER ONLY)
ALT: 21 U/L (ref 0–44)
AST: 20 U/L (ref 15–41)
Albumin: 4.4 g/dL (ref 3.5–5.0)
Alkaline Phosphatase: 48 U/L (ref 38–126)
Anion gap: 8 (ref 5–15)
BUN: 11 mg/dL (ref 8–23)
CO2: 25 mmol/L (ref 22–32)
Calcium: 9.2 mg/dL (ref 8.9–10.3)
Chloride: 104 mmol/L (ref 98–111)
Creatinine: 0.9 mg/dL (ref 0.61–1.24)
GFR, Est AFR Am: >60 mL/min (ref >60)
GFR, Estimated: >60 mL/min (ref >60)
Glucose, Bld: 159 mg/dL — ABNORMAL HIGH (ref 70–99)
Potassium: 4.6 mmol/L (ref 3.5–5.1)
Sodium: 137 mmol/L (ref 135–145)
Total Bilirubin: 0.9 mg/dL (ref 0.3–1.2)
Total Protein: 7.3 g/dL (ref 6.5–8.1)

## 2019-11-19 LAB — LACTATE DEHYDROGENASE: LDH: 174 U/L (ref 98–192)

## 2019-11-19 LAB — HIV ANTIBODY (ROUTINE TESTING W REFLEX): HIV Screen 4th Generation wRfx: NONREACTIVE

## 2019-11-19 LAB — HEPATITIS C ANTIBODY: HCV Ab: NONREACTIVE

## 2019-11-19 LAB — SEDIMENTATION RATE: Sed Rate: 2 mm/hr (ref 0–16)

## 2019-11-19 NOTE — Patient Instructions (Signed)
Thank you for choosing [facility] to provide your oncology and hematology care.   Should you have questions after your visit to the [facility] ([facility]), please contact this office at [phone] between 8:30 AM and 4:30 PM.  Voice mails left after 4:00 PM may not be returned until the following business day.  Calls received after 4:30 PM will be answered by an off-site Nurse Triage Line.    Prescription Refills:  Please have your pharmacy contact us directly for most prescription requests.  Contact the office directly for refills of narcotics (pain medications). Allow 48-72 hours for refills.  Appointments: Please contact the [facility] scheduling department [phone] for questions regarding [facility] appointment scheduling.  Contact the schedulers with any scheduling changes so that your appointment can be rescheduled in a timely manner.   Central Scheduling for [place] [phone] - Call to schedule procedures such as PET scans, CT scans, MRI, Ultrasound, etc.  To afford each patient quality time with our providers, please arrive 30 minutes before your scheduled appointment time.  If you arrive late for your appointment, you may be asked to reschedule.  We strive to give you quality time with our providers, and arriving late affects you and other patients whose appointments are after yours. If you are a no show for multiple scheduled visits, you may be dismissed from the clinic at the providers discretion.     Resources: [facility] Social Workers [phone] for additional information on assistance programs or assistance connecting with community support programs   [place] DSS  336-641-3447: Information regarding food stamps, Medicaid, and utility assistance [place] 336-333-6589   [place] [employer]'s shared-ride transportation service for eligible riders who have a disability that prevents them from riding the fixed route bus.   Medicare Rights Center  800-333-4114 Helps people with Medicare understand their rights and benefits, navigate the Medicare system, and secure the quality healthcare they deserve [employer] 800-227-2345 Assists patients locate various types of support and financial assistance Cancer Care: 1-800-813-HOPE (4673) Provides financial assistance, online support groups, medication/co-pay assistance.   Transportation Assistance for appointments at [facility]: Transportation Coordinator [phone]  Again, thank you for choosing [facility] for your care.       

## 2019-11-19 NOTE — Progress Notes (Signed)
HEMATOLOGY/ONCOLOGY CONSULTATION NOTE  Date of Service: 11/19/2019  Patient Care Team: [name], MD as PCP - General (Internal Medicine) Stanford Breed Denice Bors, MD as PCP - Cardiology (Cardiology)  CHIEF COMPLAINTS/PURPOSE OF CONSULTATION:  Lymphadenopathy  HISTORY OF PRESENTING ILLNESS:   Kenneth [name] is a wonderful 69 y.o. male who has been referred to [place] by Dr Wyatt Portela for evaluation and management of lymphadenopathy. The pt reports that he is doing well overall.   The pt reports that he began to have some back discomfort the day after his CT scan, but has otherwise felt well over the last 6 months. When he stands up, lays down, or walks he does not experience the pain. When he sits for a long period of time or slouches it begins to hurt. He denies any recent changes in his urine or urinary habits. He has previously had kidney stones. Pt also reports overall fatigue, but thinks that this is best explained by his advanced age. He has not had any recent infections. Pt has a cat that he sometimes sleeps in close quarters with. He received his COVID19 vaccine after his scan on 03/24, but prior to his scan on 04/29.  Pt does have some lower leg swelling in both legs which he attributes to weight gain. Pt has had leg swelling in the past during a Gout attack. Pt had PE in 02/20, for which no cause has been found, but there was concern that a blood clot from his lower extremities traveled to his lungs. He has continued using anti-coagulation therapy as prescribed. Pt had a localized Melanoma on his right shoulder that was removed 20 years ago. He was seen at [facility] and received a short series of injections. He last saw his Dermatologist three years ago and there were no worries of recurrence. Pt does have occasional lightheadedness, about once per week, that is improved after eating. About 3-4 years ago he was having episodes where he felt that he had to focus on breathing because he was not  breathing automatically. Pt has had an epididymal cyst for several years that has been unchanging.   Of note prior to the patient's visit today, pt has had CT Abd/Pel ([id number]) completed on 11/07/2019 with results revealing "Mild lymphadenopathy throughout the abdomen and pelvis, new since 2011 exam. Suggest correlation for clinical/laboratory evidence of lymphoproliferative disorder, and consider short-term follow-up by CT in 3 months. Stable small benign left adrenal adenoma. Left nephrolithiasis. No evidence of ureteral calculi or hydronephrosis."   Most recent lab results (10/18/2019) of CBC w/diff and CMP is as follows: all values are WNL except for RDW at 40.3, Glucose at 117.  On review of systems, pt reports lower leg swelling, lightheadedness, back pain, fatigue and denies dysuria, hematuria, discolored urine, polyuria, fevers, chills, night sweats, unexpected weight loss, loss of appetite, diarrhea, constipation, abdominal pain, testicular pain/swelling and any other symptoms.   On PMHx the pt reports PE, Palpitations, HTN, Hypercholesterolemia, Ascending Aortic Aneurysm, Gout, Melanoma. On Social Hx the pt reports that he does not smoke cigarettes and does not drink alcohol.   MEDICAL HISTORY:  Past Medical History:  Diagnosis Date  . Ascending aortic aneurysm ([place]) 09/04/2018  . Dyspnea 09/03/2018  . Elevated troponin 09/03/2018  . Gout   . Hypercholesteremia   . Hypertension   . Nephrolithiasis   . Palpitations 09/04/2018  . PE (pulmonary thromboembolism) ([place]) 09/04/2018  . Pulmonary embolism ([place])     SURGICAL HISTORY: Past Surgical History:  Procedure Laterality Date  . No prior surgery      SOCIAL HISTORY: Social History   Socioeconomic History  . Marital status: Married    Spouse name: Not on file  . Number of children: Not on file  . Years of education: Not on file  . Highest education level: Not on file  Occupational History  . Not on file  Tobacco Use  .  Smoking status: Never Smoker  . Smokeless tobacco: Never Used  Substance and Sexual Activity  . Alcohol use: Never  . Drug use: Never  . Sexual activity: Not on file  Other Topics Concern  . Not on file  Social History Narrative  . Not on file   Social Determinants of Health   Financial Resource Strain:   . Difficulty of Paying Living Expenses:   Food Insecurity:   . Worried About Charity fundraiser in the Last Year:   . Arboriculturist in the Last Year:   Transportation Needs:   . Film/video editor (Medical):   Marland Kitchen Lack of Transportation (Non-Medical):   Physical Activity:   . Days of Exercise per Week:   . Minutes of Exercise per Session:   Stress:   . Feeling of Stress :   Social Connections:   . Frequency of Communication with Friends and Family:   . Frequency of Social Gatherings with Friends and Family:   . Attends Religious Services:   . Active Member of Clubs or Organizations:   . Attends Archivist Meetings:   Marland Kitchen Marital Status:   Intimate Partner Violence:   . Fear of Current or Ex-Partner:   . Emotionally Abused:   Marland Kitchen Physically Abused:   . Sexually Abused:     FAMILY HISTORY: Family History  Problem Relation Age of Onset  . Diabetes Mother   . Hypertension Mother   . CAD Brother        CABG at age 10    ALLERGIES:  is allergic to hctz [hydrochlorothiazide].  MEDICATIONS:  Current Outpatient Medications  Medication Sig Dispense Refill  . allopurinol (ZYLOPRIM) 100 MG tablet Take 200 mg by mouth daily.   3  . amLODipine (NORVASC) 10 MG tablet Take 1 tablet (10 mg total) by mouth daily. 90 tablet 3  . apixaban (ELIQUIS) 5 MG TABS tablet Take 5 mg by mouth 2 (two) times daily.    . calcium carbonate (TUMS - DOSED IN MG ELEMENTAL CALCIUM) 500 MG chewable tablet Chew 1 tablet by mouth at bedtime as needed for indigestion or heartburn.    . carvedilol (COREG) 12.5 MG tablet TAKE 1 TABLET BY MOUTH TWICE DAILY WITH A MEAL 180 tablet 3  .  cloNIDine (CATAPRES) 0.3 MG tablet Take 0.3 mg by mouth daily.    . finasteride (PROSCAR) 5 MG tablet Take 5 mg by mouth daily.  2  . losartan (COZAAR) 100 MG tablet Take 100 mg by mouth daily.  5  . rosuvastatin (CRESTOR) 40 MG tablet Take 1 tablet (40 mg total) by mouth daily at 6 PM. 30 tablet 0   No current facility-administered medications for this visit.    REVIEW OF SYSTEMS:    10 Point review of Systems was done is negative except as noted above.  PHYSICAL EXAMINATION: ECOG PERFORMANCE STATUS: 1 - Symptomatic but completely ambulatory  . Vitals:   11/19/19 1131  BP: (!) 154/87  Pulse: 90  Resp: 18  Temp: 98.7 F (37.1 C)  SpO2: 98%  Filed Weights   11/19/19 1131  Weight: 266 lb 6.4 oz (120.8 kg)   .Body mass index is 39.34 kg/m.  GENERAL:alert, in no acute distress and comfortable SKIN: no acute rashes, no significant lesions EYES: conjunctiva are pink and non-injected, sclera anicteric OROPHARYNX: MMM, no exudates, no oropharyngeal erythema or ulceration NECK: supple, no JVD LYMPH:  no palpable lymphadenopathy in the cervical, axillary or inguinal regions LUNGS: clear to auscultation b/l with normal respiratory effort HEART: regular rate & rhythm ABDOMEN:  normoactive bowel sounds , non tender, not distended. Extremity: no pedal edema PSYCH: alert & oriented x 3 with fluent speech NEURO: no focal motor/sensory deficits  LABORATORY DATA:  I have reviewed the data as listed  . CBC Latest Ref Rng & Units 11/19/2019 09/05/2018 09/04/2018  WBC 4.0 - 10.5 K/uL 6.3 7.0 7.4  Hemoglobin 13.0 - 17.0 g/dL 14.3 13.5 13.7  Hematocrit 39.0 - 52.0 % 43.1 41.5 40.7  Platelets 150 - 400 K/uL 212 215 205    . CMP Latest Ref Rng & Units 11/19/2019 09/05/2018 09/04/2018  Glucose 70 - 99 mg/dL 159(H) 114(H) 114(H)  BUN 8 - 23 mg/dL 11 11 7(L)  Creatinine 0.61 - 1.24 mg/dL 0.90 0.93 0.93  Sodium 135 - 145 mmol/L 137 138 137  Potassium 3.5 - 5.1 mmol/L 4.6 3.6 3.8    Chloride 98 - 111 mmol/L 104 103 103  CO2 22 - 32 mmol/L 25 25 22   Calcium 8.9 - 10.3 mg/dL 9.2 8.9 8.9  Total Protein 6.5 - 8.1 g/dL 7.3 - 6.9  Total Bilirubin 0.3 - 1.2 mg/dL 0.9 - 1.1  Alkaline Phos 38 - 126 U/L 48 - 47  AST 15 - 41 U/L 20 - 34  ALT 0 - 44 U/L 21 - 29     RADIOGRAPHIC STUDIES: I have personally reviewed the radiological images as listed and agreed with the findings in the report. CT ABDOMEN PELVIS W CONTRAST  Result Date: 11/07/2019 CLINICAL DATA:  Abdominal lymphadenopathy on recent chest CT. Personal history of melanoma. EXAM: CT ABDOMEN AND PELVIS WITH CONTRAST TECHNIQUE: Multidetector CT imaging of the abdomen and pelvis was performed using the standard protocol following bolus administration of intravenous contrast. CONTRAST:  13mL ISOVUE-300 IOPAMIDOL (ISOVUE-300) INJECTION 61% COMPARISON:  Recent chest CT on 10/02/2019, and old AP CT on 10/28/2009 FINDINGS: Lower Chest: No acute findings. Hepatobiliary: No hepatic masses identified. Gallbladder is unremarkable. No evidence of biliary ductal dilatation. Pancreas:  No mass or inflammatory changes. Spleen: Within normal limits in size and appearance. Adrenals/Urinary Tract: Stable 1.6 cm left adrenal mass, consistent with benign adenoma. Stable left renal cyst. No renal masses identified. Several small less than 5 mm left renal calculi are identified no evidence of ureteral calculi or hydronephrosis. Stomach/Bowel: No evidence of obstruction, inflammatory process or abnormal fluid collections. Normal appendix visualized. Vascular/Lymphatic: Mild upper abdominal lymphadenopathy is seen in the gastrohepatic ligament, celiac axis, and porta hepatis. Mild retroperitoneal lymphadenopathy is seen in the left paraaortic and aortocaval spaces. Mild lymphadenopathy is also seen in bilateral common iliac and right external iliac lymph node chains. Index lymph node in the right external iliac chain measures 1.9 cm on image 74/2, and  another index lymph node in the right common iliac chain measures 1.9 cm on image 61/2. Numerous sub-cm lymph nodes are also seen in small bowel mesentery with associated soft tissue stranding. These findings are new since previous study, and lymphoproliferative disorder cannot be excluded. Aortic atherosclerosis incidentally noted. No evidence of abdominal  aortic aneurysm. Reproductive:  No mass or other significant abnormality. Other:  None. Musculoskeletal:  No suspicious bone lesions identified. IMPRESSION: Mild lymphadenopathy throughout the abdomen and pelvis, new since 2011 exam. Suggest correlation for clinical/laboratory evidence of lymphoproliferative disorder, and consider short-term follow-up by CT in 3 months. This recommendation is based on ACR consensus guidelines: White Paper of the ACR Incidental Findings Committee II on Splenic and Nodal Findings. [place] [phone]. Stable small benign left adrenal adenoma. Left nephrolithiasis. No evidence of ureteral calculi or hydronephrosis. Electronically Signed   By: Marlaine Hind M.D.   On: 11/07/2019 15:36   CTA C/A/P 10/02/2019: IMPRESSION: 1. Stable aneurysmal dilatation of the ascending thoracic aorta. Recommend annual imaging followup by CTA or MRA. This recommendation follows 2010 ACCF/AHA/AATS/ACR/ASA/SCA/SCAI/SIR/STS/SVM Guidelines for the Diagnosis and Management of Patients with Thoracic Aortic Disease. Circulation. 2010; [id number]. Aortic aneurysm NOS (ICD10-I71.9) 2. Interval development of mediastinal and upper abdominal lymphadenopathy. Please correlate with any history of lymphoma or leukemia. Dedicated CT of the abdomen and pelvis may be useful to evaluate the extent of the abdominal adenopathy. 3. No evidence of pulmonary emboli on this exam. Resolution of the segmental lower lobe emboli seen previously.  These results will be called to the ordering clinician or representative by the Radiologist Assistant,  and communication documented in the PACS or [employer].   Electronically Signed   By: Randa Ngo M.D.   On: 10/02/2019 17:00   ASSESSMENT & PLAN:   69 yo with   1) Generalized lymphadenopathy in the mediastinum and throughout abdomen and pelvis- concern for lymphoma in the absence of a evidence of an extranodal primary tumor. PLAN: -Discussed patient's most recent labs from 10/18/2019, all values are WNL except for RDW at 40.3, Glucose at 117. -Discussed CTA chest/abd/pelvis 10/02/2019- mediastinal and abdominal LNadenopathy -Discussed 11/07/2019 CT Abd/Pel ([id number]) which revealed "Mild lymphadenopathy throughout the abdomen and pelvis, new since 2011 exam. Suggest correlation for clinical/laboratory evidence of lymphoproliferative disorder, and consider short-term follow-up by CT in 3 months. Stable small benign left adrenal adenoma. Left nephrolithiasis. No evidence of ureteral calculi or hydronephrosis."  -Advised pt that lymphadenopathy is generalized and would be concerning for lymphoma though other non cancerous etiology is possible as well. -Advised pt that we would need to work this up further for a more definitive answer.  -Discussed further workup including PET/CT scan, lymph node biopsy, and additional labs  -Advised pt that we would prefer to get a needle biopsy of the lymph node that is most accessible and appears active on PET/CT -Will get labs today  -Will get PET/CT in 5-7 days  -Plan to get needle biopsy depending on PET/CT scan  -Will see back in 2 weeks via phone   FOLLOW UP: Labs today PET/CT in 5-7 days Phone visit with Dr Irene Limbo in 2 weeks  . Orders Placed This Encounter  Procedures  . NM PET Image Initial (PI) Skull Base To Thigh    Standing Status:   Future    Standing Expiration Date:   11/18/2020    Order Specific Question:   ** REASON FOR EXAM (FREE TEXT)    Answer:   Mediastinal and abdominal Lymphadenopathy concerning for lymphoma vs  metastatic disease with unknown primary    Order Specific Question:   If indicated for the ordered procedure, I authorize the administration of a radiopharmaceutical per Radiology protocol    Answer:   Yes    Order Specific Question:   Preferred imaging location?  Answer:   [facility]    Order Specific Question:   Radiology Contrast Protocol - do NOT remove file path    Answer:   [email or web address]  . CBC with Differential/Platelet    Standing Status:   Future    Number of Occurrences:   1    Standing Expiration Date:   12/23/2020  . CMP ([place] only)    Standing Status:   Future    Number of Occurrences:   1    Standing Expiration Date:   11/18/2020  . Lactate dehydrogenase    Standing Status:   Future    Number of Occurrences:   1    Standing Expiration Date:   11/18/2020  . HIV Antibody (routine testing w rflx)    Standing Status:   Future    Number of Occurrences:   1    Standing Expiration Date:   11/18/2020  . Hepatitis C antibody    Standing Status:   Future    Number of Occurrences:   1    Standing Expiration Date:   11/18/2020  . Sedimentation rate    Standing Status:   Future    Number of Occurrences:   1    Standing Expiration Date:   11/18/2020     All of the patients questions were answered with apparent satisfaction. The patient knows to call the clinic with any problems, questions or concerns.  I spent 40 mins counseling the patient face to face. The total time spent in the appointment was 60 minutes and more than 50% was on counseling and direct patient cares.    Sullivan Lone MD [place] AAHIVMS [facility] Hematology/Oncology Physician [facility]  (Office):       [phone] (Work cell):  [phone] (Fax):           [phone]  11/19/2019 5:58 PM  I, Yevette Edwards, am acting as a scribe for Dr. Sullivan Lone.   .I have reviewed the above documentation for accuracy and completeness, and I agree with the  above. Kenneth Genera MD

## 2019-11-22 ENCOUNTER — Telehealth: Payer: Self-pay | Admitting: Hematology

## 2019-11-22 NOTE — Telephone Encounter (Signed)
Scheduled per 05/11 los, patient has been called and voicemail was left.

## 2019-12-02 ENCOUNTER — Other Ambulatory Visit: Payer: Self-pay

## 2019-12-02 ENCOUNTER — Encounter (HOSPITAL_COMMUNITY)
Admission: RE | Admit: 2019-12-02 | Discharge: 2019-12-02 | Disposition: A | Discharge status: Home / Self Care | Payer: Medicare Other | Source: Ambulatory Visit | Attending: Hematology | Admitting: Hematology

## 2019-12-02 DIAGNOSIS — R591 Generalized enlarged lymph nodes: Secondary | ICD-10-CM | POA: Diagnosis not present

## 2019-12-02 DIAGNOSIS — D479 Neoplasm of uncertain behavior of lymphoid, hematopoietic and related tissue, unspecified: Secondary | ICD-10-CM | POA: Diagnosis present

## 2019-12-02 DIAGNOSIS — I2510 Atherosclerotic heart disease of native coronary artery without angina pectoris: Secondary | ICD-10-CM | POA: Diagnosis not present

## 2019-12-02 DIAGNOSIS — N200 Calculus of kidney: Secondary | ICD-10-CM | POA: Diagnosis not present

## 2019-12-02 DIAGNOSIS — I700 Atherosclerosis of aorta: Secondary | ICD-10-CM | POA: Insufficient documentation

## 2019-12-02 LAB — GLUCOSE, CAPILLARY: Glucose-Capillary: 111 mg/dL — ABNORMAL HIGH (ref 70–99)

## 2019-12-02 MED ORDER — FLUDEOXYGLUCOSE F - 18 (FDG) INJECTION
14.2400 | Freq: Once | INTRAVENOUS | Status: AC
  Administered 2019-12-02: 14.24 via INTRAVENOUS

## 2019-12-05 NOTE — Progress Notes (Signed)
HEMATOLOGY/ONCOLOGY CONSULTATION NOTE  Date of Service: 12/06/2019  Patient Care Team: [name], MD as PCP - General (Internal Medicine) Stanford Breed Denice Bors, MD as PCP - Cardiology (Cardiology)  CHIEF COMPLAINTS/PURPOSE OF CONSULTATION:  Lymphadenopathy  HISTORY OF PRESENTING ILLNESS:  I connected with Kenneth [name] on 12/06/19 at  9:20 AM EDT and verified that I am speaking with the correct person using two identifiers.   I discussed the limitations, risks, security and privacy concerns of performing an evaluation and management service by telemedicine and the availability of in-person appointments. I also discussed with the patient that there may be a patient responsible charge related to this service. The patient expressed understanding and agreed to proceed.   Other persons participating in the visit and their role in the encounter:     -Dawayne Cirri, Medical Scribe  Patient's location: Home  Provider's location: [place] [facility] [place] is a wonderful 69 y.o. male who has been referred to [place] by Dr Wyatt Portela for evaluation and management of lymphadenopathy. The patient's last visit with [place] was on 11/19/19. The pt reports that he is doing well overall.  The pt reports he is good. He has not had any swallowing difficulty, soar throat or throat infections.   Of note since the patient's last visit, pt has had PET Initial Skull Base to Thigh ([id number]) completed on 12/02/19 with results revealing "1. Hypermetabolic lymph nodes in the chest, abdomen, and pelvis. Differential considerations would include lymphoma and metastatic disease. 2. Nonobstructing left renal stones. 3.  Aortic Atherosclerois (ICD10-170.0)"  Lab results today (11/19/19) of CBC w/diff and CMP is as follows: all values are WNL except for Glucose at 159 11/19/19 of Sed Rate at 2: WNL 11/19/19 of Hepatitis C antibody at Non reactive 11/19/19 of HIV Antibody (routine testing w rflx) at Non  reactive 11/19/19 of LDH at 174: WNL 12/02/19 of Glucose-Capillary at 111  On review of systems, pt denies swallowing difficulty, soar throat, throat infection and any other symptoms.   MEDICAL HISTORY:  Past Medical History:  Diagnosis Date  . Ascending aortic aneurysm ([place]) 09/04/2018  . Dyspnea 09/03/2018  . Elevated troponin 09/03/2018  . Gout   . Hypercholesteremia   . Hypertension   . Nephrolithiasis   . Palpitations 09/04/2018  . PE (pulmonary thromboembolism) ([place]) 09/04/2018  . Pulmonary embolism ([place])     SURGICAL HISTORY: Past Surgical History:  Procedure Laterality Date  . No prior surgery      SOCIAL HISTORY: Social History   Socioeconomic History  . Marital status: Married    Spouse name: Not on file  . Number of children: Not on file  . Years of education: Not on file  . Highest education level: Not on file  Occupational History  . Not on file  Tobacco Use  . Smoking status: Never Smoker  . Smokeless tobacco: Never Used  Substance and Sexual Activity  . Alcohol use: Never  . Drug use: Never  . Sexual activity: Not on file  Other Topics Concern  . Not on file  Social History Narrative  . Not on file   Social Determinants of Health   Financial Resource Strain:   . Difficulty of Paying Living Expenses:   Food Insecurity:   . Worried About Charity fundraiser in the Last Year:   . Arboriculturist in the Last Year:   Transportation Needs:   . Film/video editor (Medical):   Marland Kitchen  Lack of Transportation (Non-Medical):   Physical Activity:   . Days of Exercise per Week:   . Minutes of Exercise per Session:   Stress:   . Feeling of Stress :   Social Connections:   . Frequency of Communication with Friends and Family:   . Frequency of Social Gatherings with Friends and Family:   . Attends Religious Services:   . Active Member of Clubs or Organizations:   . Attends Archivist Meetings:   Marland Kitchen Marital Status:   Intimate Partner Violence:     . Fear of Current or Ex-Partner:   . Emotionally Abused:   Marland Kitchen Physically Abused:   . Sexually Abused:     FAMILY HISTORY: Family History  Problem Relation Age of Onset  . Diabetes Mother   . Hypertension Mother   . CAD Brother        CABG at age 68    ALLERGIES:  is allergic to hctz [hydrochlorothiazide].  MEDICATIONS:  Current Outpatient Medications  Medication Sig Dispense Refill  . allopurinol (ZYLOPRIM) 100 MG tablet Take 200 mg by mouth daily.   3  . amLODipine (NORVASC) 10 MG tablet Take 1 tablet (10 mg total) by mouth daily. 90 tablet 3  . apixaban (ELIQUIS) 5 MG TABS tablet Take 5 mg by mouth 2 (two) times daily.    . calcium carbonate (TUMS - DOSED IN MG ELEMENTAL CALCIUM) 500 MG chewable tablet Chew 1 tablet by mouth at bedtime as needed for indigestion or heartburn.    . carvedilol (COREG) 12.5 MG tablet TAKE 1 TABLET BY MOUTH TWICE DAILY WITH A MEAL 180 tablet 3  . cloNIDine (CATAPRES) 0.3 MG tablet Take 0.3 mg by mouth daily.    . finasteride (PROSCAR) 5 MG tablet Take 5 mg by mouth daily.  2  . losartan (COZAAR) 100 MG tablet Take 100 mg by mouth daily.  5  . rosuvastatin (CRESTOR) 40 MG tablet Take 1 tablet (40 mg total) by mouth daily at 6 PM. 30 tablet 0   No current facility-administered medications for this visit.    REVIEW OF SYSTEMS:   A 10+ POINT REVIEW OF SYSTEMS WAS OBTAINED including neurology, dermatology, psychiatry, cardiac, respiratory, lymph, extremities, GI, GU, Musculoskeletal, constitutional, breasts, reproductive, HEENT.  All pertinent positives are noted in the HPI.  All others are negative.  PHYSICAL EXAMINATION: ECOG PERFORMANCE STATUS: 1 - Symptomatic but completely ambulatory  . There were no vitals filed for this visit. There were no vitals filed for this visit. .There is no height or weight on file to calculate BMI.  Tele-health Visit   LABORATORY DATA:  I have reviewed the data as listed  . CBC Latest Ref Rng & Units  11/19/2019 09/05/2018 09/04/2018  WBC 4.0 - 10.5 K/uL 6.3 7.0 7.4  Hemoglobin 13.0 - 17.0 g/dL 14.3 13.5 13.7  Hematocrit 39.0 - 52.0 % 43.1 41.5 40.7  Platelets 150 - 400 K/uL 212 215 205    . CMP Latest Ref Rng & Units 11/19/2019 09/05/2018 09/04/2018  Glucose 70 - 99 mg/dL 159(H) 114(H) 114(H)  BUN 8 - 23 mg/dL 11 11 7(L)  Creatinine 0.61 - 1.24 mg/dL 0.90 0.93 0.93  Sodium 135 - 145 mmol/L 137 138 137  Potassium 3.5 - 5.1 mmol/L 4.6 3.6 3.8  Chloride 98 - 111 mmol/L 104 103 103  CO2 22 - 32 mmol/L 25 25 22   Calcium 8.9 - 10.3 mg/dL 9.2 8.9 8.9  Total Protein 6.5 - 8.1 g/dL 7.3 -  6.9  Total Bilirubin 0.3 - 1.2 mg/dL 0.9 - 1.1  Alkaline Phos 38 - 126 U/L 48 - 47  AST 15 - 41 U/L 20 - 34  ALT 0 - 44 U/L 21 - 29     RADIOGRAPHIC STUDIES: I have personally reviewed the radiological images as listed and agreed with the findings in the report. CT ABDOMEN PELVIS W CONTRAST  Result Date: 11/07/2019 CLINICAL DATA:  Abdominal lymphadenopathy on recent chest CT. Personal history of melanoma. EXAM: CT ABDOMEN AND PELVIS WITH CONTRAST TECHNIQUE: Multidetector CT imaging of the abdomen and pelvis was performed using the standard protocol following bolus administration of intravenous contrast. CONTRAST:  168mL ISOVUE-300 IOPAMIDOL (ISOVUE-300) INJECTION 61% COMPARISON:  Recent chest CT on 10/02/2019, and old AP CT on 10/28/2009 FINDINGS: Lower Chest: No acute findings. Hepatobiliary: No hepatic masses identified. Gallbladder is unremarkable. No evidence of biliary ductal dilatation. Pancreas:  No mass or inflammatory changes. Spleen: Within normal limits in size and appearance. Adrenals/Urinary Tract: Stable 1.6 cm left adrenal mass, consistent with benign adenoma. Stable left renal cyst. No renal masses identified. Several small less than 5 mm left renal calculi are identified no evidence of ureteral calculi or hydronephrosis. Stomach/Bowel: No evidence of obstruction, inflammatory process or abnormal  fluid collections. Normal appendix visualized. Vascular/Lymphatic: Mild upper abdominal lymphadenopathy is seen in the gastrohepatic ligament, celiac axis, and porta hepatis. Mild retroperitoneal lymphadenopathy is seen in the left paraaortic and aortocaval spaces. Mild lymphadenopathy is also seen in bilateral common iliac and right external iliac lymph node chains. Index lymph node in the right external iliac chain measures 1.9 cm on image 74/2, and another index lymph node in the right common iliac chain measures 1.9 cm on image 61/2. Numerous sub-cm lymph nodes are also seen in small bowel mesentery with associated soft tissue stranding. These findings are new since previous study, and lymphoproliferative disorder cannot be excluded. Aortic atherosclerosis incidentally noted. No evidence of abdominal aortic aneurysm. Reproductive:  No mass or other significant abnormality. Other:  None. Musculoskeletal:  No suspicious bone lesions identified. IMPRESSION: Mild lymphadenopathy throughout the abdomen and pelvis, new since 2011 exam. Suggest correlation for clinical/laboratory evidence of lymphoproliferative disorder, and consider short-term follow-up by CT in 3 months. This recommendation is based on ACR consensus guidelines: White Paper of the ACR Incidental Findings Committee II on Splenic and Nodal Findings. [place] [phone]. Stable small benign left adrenal adenoma. Left nephrolithiasis. No evidence of ureteral calculi or hydronephrosis. Electronically Signed   By: Marlaine Hind M.D.   On: 11/07/2019 15:36   NM PET Image Initial (PI) Skull Base To Thigh  Result Date: 12/02/2019 CLINICAL DATA:  Initial treatment strategy for mediastinal and abdominal lymphadenopathy. EXAM: NUCLEAR MEDICINE PET SKULL BASE TO THIGH TECHNIQUE: 14.2 mCi F-18 FDG was injected intravenously. Full-ring PET imaging was performed from the skull base to thigh after the radiotracer. CT data was obtained and used for  attenuation correction and anatomic localization. Fasting blood glucose: 111 mg/dl COMPARISON:  CT scans from 11/07/2019 and 10/02/2019 FINDINGS: Mediastinal blood pool activity: SUV max 2.8 Liver activity: SUV max 3.7 NECK: Asymmetric hypermetabolism identified in the right vallecula ( SUV max = 7.4) with asymmetry of the soft tissues in this region. 10 mm short axis right level II B lymph node is hypermetabolic with SUV max = 3.3. Incidental CT findings: none CHEST: Upper normal mediastinal lymph nodes are hypermetabolic. 8 mm para-aortic node on 68/4 demonstrates SUV max = 4.7. 10 mm short axis  left para-aortic node on [id number] is hypermetabolic with SUV max = 5.8. No hypermetabolic pulmonary nodule. Incidental CT findings: Aberrant right subclavian artery noted. There is abdominal aortic atherosclerosis without aneurysm. Coronary artery calcification is evident. ABDOMEN/PELVIS: Upper abdominal lymph nodes seen on previous CT show hypermetabolic FDG accumulation. 12 mm node visible on 107/4 has SUV max = 4.6. 16 mm short axis aortocaval node on 01/30 4/3 demonstrates SUV max = 6.2. 15 mm right common iliac node on 154/4 demonstrates SUV max = 9.9. 14 mm short axis right external iliac node on 180/4 demonstrates SUV max = 6.8. Haziness in the central small bowel mesentery is similar to prior in there are some hypermetabolic lymph nodes in the central mesentery as well. Incidental CT findings: 3-4 mm nonobstructing stone identified interpolar left kidney with additional tiny left rib nonobstructing renal stones. 2.3 cm low-density exophytic lesion interpolar left kidney has attenuation higher than would be expected for a simple cyst but shows no hypermetabolism suggesting cyst complicated by proteinaceous debris or hemorrhage.Diverticular changes noted in the colon without diverticulitis. SKELETON: Mottled FDG accumulation identified within the bony anatomy, non this ossific Incidental CT findings: No worrisome lytic  or sclerotic osseous abnormality. IMPRESSION: 1. Hypermetabolic lymph nodes in the chest, abdomen, and pelvis. Differential considerations would include lymphoma and metastatic disease. 2. Nonobstructing left renal stones. 3.  Aortic Atherosclerois (ICD10-170.0) 4. Electronically Signed   By: Misty Stanley M.D.   On: 12/02/2019 13:45   CTA C/A/P 10/02/2019: IMPRESSION: 1. Stable aneurysmal dilatation of the ascending thoracic aorta. Recommend annual imaging followup by CTA or MRA. This recommendation follows 2010 ACCF/AHA/AATS/ACR/ASA/SCA/SCAI/SIR/STS/SVM Guidelines for the Diagnosis and Management of Patients with Thoracic Aortic Disease. Circulation. 2010; [id number]. Aortic aneurysm NOS (ICD10-I71.9) 2. Interval development of mediastinal and upper abdominal lymphadenopathy. Please correlate with any history of lymphoma or leukemia. Dedicated CT of the abdomen and pelvis may be useful to evaluate the extent of the abdominal adenopathy. 3. No evidence of pulmonary emboli on this exam. Resolution of the segmental lower lobe emboli seen previously.  These results will be called to the ordering clinician or representative by the Radiologist Assistant, and communication documented in the PACS or [employer].   Electronically Signed   By: Randa Ngo M.D.   On: 10/02/2019 17:00   ASSESSMENT & PLAN:   69 yo with   1) Generalized lymphadenopathy in the mediastinum and throughout abdomen and pelvis- concern for lymphoma in the absence of a evidence of an extranodal primary tumor. PLAN: -Discussed pt labwork today, 11/19/19;  of CBC w/diff and CMP is as follows: all values are WNL except for Glucose at 159 -Discussed 11/19/19 of Sed Rate at 2: WNL -Discussed 11/19/19 of Hepatitis C antibody at Non reactive -Discussed 11/19/19 of HIV Antibody (routine testing w rflx) at Non reactive -Discussed 11/19/19 of LDH at 174: WNL -Discussed 12/02/19 of Glucose-Capillary at  111 -Discussed 12/02/19 of PET Initial Skull Base to Thigh ([id number]) -Advised on incidental findings  -Advised unsure if pathologies are related-will evaluate both sites independently  -Advised pt that lymphadenopathy is generalized and would be concerning for lymphoma though other non cancerous etiology is possible as well. -Advised pt that we would prefer to get a needle biopsy of the lymph node that is most accessible and appears active on PET/CT -Advised on normal lymph nodes- size, nature of appearance, lighting up -Advised when lymph nodes light up- more active and using glucose- marker of inflammation, infection, or tumor  -Advised on right  vallecula lighting up on PET scan  -Advised on non-specific inflammation of throat unrelated to lymph nodes vs related to lymph nodes  -Advised seeing lymph nodes above and below diaphragm -Advised no clear evidence of primary tumor in organs -Advised tumor of lymph nodes (lymphoma) -Advised lymph node biopsy would be the largest and brightest- probably one in abdomen or pelvis  -Recommends needle biopsy  -Recommends seeing ENT -will give referral  -RTC in 3 weeks    FOLLOW UP: -urgent ENT referral to Dr Radene Journey in 1 week for evaluation of rt vallecula thickening and hypermetabolism in setting of generalized Lymphadenopathy concerning for lymphoma. -CT biopsy of abdomino-pelvic hypermetabolic LN concerning for lymphoma in 1 week. -RTC with Dr Irene Limbo in 3 weeks  . Orders Placed This Encounter  Procedures  . CT Biopsy    Standing Status:   Future    Standing Expiration Date:   12/05/2020    Order Specific Question:   Lab orders requested (DO NOT place separate lab orders, these will be automatically ordered during procedure specimen collection):    Answer:   Surgical Pathology    Order Specific Question:   Reason for Exam (SYMPTOM  OR DIAGNOSIS REQUIRED)    Answer:   CT guided biopsy of hypermetabolic abdomino-pelvic lymphadenopathy  concerning for lymphoma    Order Specific Question:   Preferred location?    Answer:   [facility]    Order Specific Question:   Radiology Contrast Protocol - do NOT remove file path    Answer:   [email or web address]  . Ambulatory referral to ENT    Referral Priority:   Urgent    Referral Type:   Consultation    Referral Reason:   Specialty Services Required    Requested Specialty:   Otolaryngology    Number of Visits Requested:   1     The total time spent in the appt was 20 minutes and more than 50% was on counseling and direct patient cares.  All of the patient's questions were answered with apparent satisfaction. The patient knows to call the clinic with any problems, questions or concerns.   Sullivan Lone MD MS AAHIVMS [facility] Hematology/Oncology Physician [facility]  (Office):       [phone] (Work cell):  [phone] (Fax):           [phone]  12/06/2019 9:46 AM  I, Dawayne Cirri am acting as a scribe for Dr. Sullivan Lone.   .I have reviewed the above documentation for accuracy and completeness, and I agree with the above. [name] MD

## 2019-12-06 ENCOUNTER — Encounter (HOSPITAL_COMMUNITY): Payer: Self-pay | Admitting: Radiology

## 2019-12-06 ENCOUNTER — Inpatient Hospital Stay (HOSPITAL_BASED_OUTPATIENT_CLINIC_OR_DEPARTMENT_OTHER): Payer: Medicare Other | Admitting: Hematology

## 2019-12-06 DIAGNOSIS — J392 Other diseases of pharynx: Secondary | ICD-10-CM

## 2019-12-06 DIAGNOSIS — R591 Generalized enlarged lymph nodes: Secondary | ICD-10-CM

## 2019-12-06 NOTE — Progress Notes (Signed)
Kenneth [name] Male, 69 y.o., 1951-02-15 MRN:  [id number] Phone:  [phone] [name]) PCP:  [name], MD Coverage:  [place] [facility] ([facility]-CT 3) 12/16/2019 at 8:00 AM  FW: STAT Biopsy Received: Today Message Contents  Donn Pierini D      Previous Messages   ----- Message -----  From: [name], MD  Sent: 12/06/2019 11:35 AM EDT  To: [name]  Subject: RE: STAT Biopsy                  Not easiest case to sample, but ok for CT guided LN biopsy. Consider lower left para-aortic at level of lower left kidney or node(s) lateral to lower IVC/iliac confluence or anterior to lower right kidney.   GY   ----- Message -----  From: [name]  Sent: 12/06/2019 11:10 AM EDT  To: [name], [name]  Subject: STAT Biopsy                    Procedure Requested: CT guided biopsy of hypermetabolic abdomino-pelvic lymphadenopathy    Reason for Procedure: concerning for lymphoma    Provider Requesting: [name]  Provider Telephone: [phone]    Other Info: Rad exams in Epic

## 2019-12-10 ENCOUNTER — Encounter (HOSPITAL_COMMUNITY): Payer: Self-pay | Admitting: Radiology

## 2019-12-10 NOTE — Progress Notes (Signed)
Kenneth [name] Male, 69 y.o., 1950-08-30 MRN:  [id number] Phone:  [phone] [name]) PCP:  [name], MD Coverage:  [place] [facility] ([facility]-CT 3) 12/16/2019 at 8:00 AM  RE: STAT Biopsy Received: Today Message Contents  [name], MD  [name] to hold Eliquis for 2 days for biopsy.  Thx  GK       Previous Messages   ----- Message -----  From: [name]  Sent: 12/06/2019 12:26 PM EDT  To: [name], MD  Subject: FW: STAT Biopsy                  Patient is on Eliquis and need to know if it is okay to hold for 2 days prior to Biopsy? Thanks [name]  ----- Message -----  From: [name]  Sent: 12/06/2019 11:57 AM EDT  To: [name]  Subject: FW: STAT Biopsy                   ----- Message -----  From: [name], MD  Sent: 12/06/2019 11:35 AM EDT  To: [name]  Subject: RE: STAT Biopsy                  Not easiest case to sample, but ok for CT guided LN biopsy. Consider lower left para-aortic at level of lower left kidney or node(s) lateral to lower IVC/iliac confluence or anterior to lower right kidney.   GY   ----- Message -----  From: [name]  Sent: 12/06/2019 11:10 AM EDT  To: [name], [name]  Subject: STAT Biopsy                    Procedure Requested: CT guided biopsy of hypermetabolic abdomino-pelvic lymphadenopathy    Reason for Procedure: concerning for lymphoma    Provider Requesting: [name]  Provider Telephone: [phone]    Other Info: Rad exams in Epic

## 2019-12-12 ENCOUNTER — Other Ambulatory Visit: Payer: Self-pay | Admitting: Radiology

## 2019-12-13 ENCOUNTER — Other Ambulatory Visit: Payer: Self-pay | Admitting: Radiology

## 2019-12-16 ENCOUNTER — Other Ambulatory Visit: Payer: Self-pay

## 2019-12-16 ENCOUNTER — Ambulatory Visit (HOSPITAL_COMMUNITY)
Admission: RE | Admit: 2019-12-16 | Discharge: 2019-12-16 | Disposition: A | Discharge status: Home / Self Care | Payer: Medicare Other | Source: Ambulatory Visit | Attending: Hematology | Admitting: Hematology

## 2019-12-16 ENCOUNTER — Encounter (HOSPITAL_COMMUNITY): Payer: Self-pay

## 2019-12-16 ENCOUNTER — Ambulatory Visit (HOSPITAL_COMMUNITY): Payer: Medicare Other

## 2019-12-16 DIAGNOSIS — Z79899 Other long term (current) drug therapy: Secondary | ICD-10-CM | POA: Diagnosis not present

## 2019-12-16 DIAGNOSIS — M109 Gout, unspecified: Secondary | ICD-10-CM | POA: Diagnosis not present

## 2019-12-16 DIAGNOSIS — R591 Generalized enlarged lymph nodes: Secondary | ICD-10-CM | POA: Insufficient documentation

## 2019-12-16 DIAGNOSIS — I714 Abdominal aortic aneurysm, without rupture: Secondary | ICD-10-CM | POA: Diagnosis not present

## 2019-12-16 DIAGNOSIS — N200 Calculus of kidney: Secondary | ICD-10-CM | POA: Diagnosis not present

## 2019-12-16 DIAGNOSIS — Z7901 Long term (current) use of anticoagulants: Secondary | ICD-10-CM | POA: Insufficient documentation

## 2019-12-16 DIAGNOSIS — I700 Atherosclerosis of aorta: Secondary | ICD-10-CM | POA: Insufficient documentation

## 2019-12-16 DIAGNOSIS — Z86711 Personal history of pulmonary embolism: Secondary | ICD-10-CM | POA: Diagnosis not present

## 2019-12-16 DIAGNOSIS — Z8249 Family history of ischemic heart disease and other diseases of the circulatory system: Secondary | ICD-10-CM | POA: Diagnosis not present

## 2019-12-16 DIAGNOSIS — Z85820 Personal history of malignant melanoma of skin: Secondary | ICD-10-CM | POA: Insufficient documentation

## 2019-12-16 DIAGNOSIS — I10 Essential (primary) hypertension: Secondary | ICD-10-CM | POA: Insufficient documentation

## 2019-12-16 DIAGNOSIS — E7800 Pure hypercholesterolemia, unspecified: Secondary | ICD-10-CM | POA: Diagnosis not present

## 2019-12-16 LAB — PROTIME-INR
INR: 1 (ref 0.8–1.2)
Prothrombin Time: 13.2 seconds (ref 11.4–15.2)

## 2019-12-16 LAB — CBC
HCT: 43.2 % (ref 39.0–52.0)
Hemoglobin: 14.4 g/dL (ref 13.0–17.0)
MCH: 28.3 pg (ref 26.0–34.0)
MCHC: 33.3 g/dL (ref 30.0–36.0)
MCV: 84.9 fL (ref 80.0–100.0)
Platelets: 254 10*3/uL (ref 150–400)
RBC: 5.09 MIL/uL (ref 4.22–5.81)
RDW: 12.8 % (ref 11.5–15.5)
WBC: 6.7 10*3/uL (ref 4.0–10.5)
nRBC: 0 % (ref 0.0–0.2)

## 2019-12-16 MED ORDER — LIDOCAINE HCL 1 % IJ SOLN
INTRAMUSCULAR | Status: AC
  Filled 2019-12-16: qty 20

## 2019-12-16 MED ORDER — SODIUM CHLORIDE 0.9 % IV SOLN: INTRAVENOUS | Status: DC

## 2019-12-16 MED ORDER — SODIUM CHLORIDE 0.9 % IV SOLN
INTRAVENOUS | Status: AC | PRN
  Administered 2019-12-16: 10 mL/h via INTRAVENOUS

## 2019-12-16 MED ORDER — MIDAZOLAM HCL 2 MG/2ML IJ SOLN
INTRAMUSCULAR | Status: AC
  Filled 2019-12-16: qty 2

## 2019-12-16 MED ORDER — FENTANYL CITRATE (PF) 100 MCG/2ML IJ SOLN
INTRAMUSCULAR | Status: AC | PRN
  Administered 2019-12-16: 50 ug via INTRAVENOUS
  Administered 2019-12-16 (×2): 25 ug via INTRAVENOUS

## 2019-12-16 MED ORDER — FENTANYL CITRATE (PF) 100 MCG/2ML IJ SOLN
INTRAMUSCULAR | Status: AC
  Filled 2019-12-16: qty 2

## 2019-12-16 MED ORDER — MIDAZOLAM HCL 2 MG/2ML IJ SOLN
INTRAMUSCULAR | Status: AC | PRN
  Administered 2019-12-16: 0.5 mg via INTRAVENOUS
  Administered 2019-12-16: 1 mg via INTRAVENOUS
  Administered 2019-12-16: 0.5 mg via INTRAVENOUS

## 2019-12-16 NOTE — H&P (Signed)
Chief Complaint: Patient was seen in consultation today for Abdomino-pelvic lymph node biopsy at the request of [name]  Referring Physician(s): [name]  Supervising Physician: Daryll Brod  Patient Status: [facility] - Out-pt  History of Present Illness: Kenneth [name] is a 69 y.o. male   Pt with Melanoma Hx -- 20 yrs ago Excised and treated at [facility]  Was having routine follow up of AAA when abdomino-pelvic lymphadenopathy was noted 09/2019  Further evaluation showing abnormal LAN  +PET 12/02/19:IMPRESSION: 1. Hypermetabolic lymph nodes in the chest, abdomen, and pelvis. Differential considerations would include lymphoma and metastatic disease. 2. Nonobstructing left renal stones. 3.  Aortic Atherosclerois (ICD10-170.0)  Request made for Biopsy of abdomin-pelvic lymphadenopathy    Past Medical History:  Diagnosis Date  . Ascending aortic aneurysm ([place]) 09/04/2018  . Dyspnea 09/03/2018  . Elevated troponin 09/03/2018  . Gout   . Hypercholesteremia   . Hypertension   . Nephrolithiasis   . Palpitations 09/04/2018  . PE (pulmonary thromboembolism) ([place]) 09/04/2018  . Pulmonary embolism [facility])     Past Surgical History:  Procedure Laterality Date  . No prior surgery      Allergies: Hctz [hydrochlorothiazide]  Medications: Prior to Admission medications   Medication Sig Start Date End Date Taking? Authorizing Provider  allopurinol (ZYLOPRIM) 100 MG tablet Take 100 mg by mouth 2 (two) times daily.  10/27/17  Yes [provider]  amLODipine (NORVASC) 10 MG tablet Take 1 tablet (10 mg total) by mouth daily. 09/19/19 12/18/19 Yes Crenshaw, Denice Bors, MD  apixaban (ELIQUIS) 5 MG TABS tablet Take 5 mg by mouth 2 (two) times daily.   Yes [provider]  calcium carbonate (TUMS - DOSED IN MG ELEMENTAL CALCIUM) 500 MG chewable tablet Chew 1 tablet by mouth at bedtime as needed for indigestion or heartburn.   Yes [provider]   carvedilol (COREG) 12.5 MG tablet TAKE 1 TABLET BY MOUTH TWICE DAILY WITH A MEAL Patient taking differently: Take 12.5 mg by mouth 2 (two) times daily with a meal.  10/18/19  Yes Crenshaw, Denice Bors, MD  cloNIDine (CATAPRES) 0.3 MG tablet Take 0.3 mg by mouth daily. 08/11/18  Yes [provider]  finasteride (PROSCAR) 5 MG tablet Take 5 mg by mouth daily. 11/30/17  Yes [provider]  losartan (COZAAR) 100 MG tablet Take 100 mg by mouth daily. 12/20/17  Yes [provider]  rosuvastatin (CRESTOR) 40 MG tablet Take 1 tablet (40 mg total) by mouth daily at 6 PM. 09/05/18  Yes Geradine Girt, DO     Family History  Problem Relation Age of Onset  . Diabetes Mother   . Hypertension Mother   . CAD Brother        CABG at age 69    Social History   Socioeconomic History  . Marital status: Married    Spouse name: Not on file  . Number of children: Not on file  . Years of education: Not on file  . Highest education level: Not on file  Occupational History  . Not on file  Tobacco Use  . Smoking status: Never Smoker  . Smokeless tobacco: Never Used  Substance and Sexual Activity  . Alcohol use: Never  . Drug use: Never  . Sexual activity: Not on file  Other Topics Concern  . Not on file  Social History Narrative  . Not on file   Social Determinants of Health   Financial Resource Strain:   . Difficulty  of Paying Living Expenses:   Food Insecurity:   . Worried About Charity fundraiser in the Last Year:   . Arboriculturist in the Last Year:   Transportation Needs:   . Film/video editor (Medical):   Marland Kitchen Lack of Transportation (Non-Medical):   Physical Activity:   . Days of Exercise per Week:   . Minutes of Exercise per Session:   Stress:   . Feeling of Stress :   Social Connections:   . Frequency of Communication with Friends and Family:   . Frequency of Social Gatherings with Friends and Family:   . Attends Religious Services:   . Active Member of  Clubs or Organizations:   . Attends Archivist Meetings:   Marland Kitchen Marital Status:     Review of Systems: A 12 point ROS discussed and pertinent positives are indicated in the HPI above.  All other systems are negative.  Review of Systems  Constitutional: Negative for activity change, fatigue and fever.  Respiratory: Negative for cough and shortness of breath.   Cardiovascular: Negative for chest pain.  Gastrointestinal: Negative for abdominal pain.  Musculoskeletal: Negative for back pain.  Psychiatric/Behavioral: Negative for behavioral problems and confusion.    Vital Signs: BP (!) 144/87   Pulse 63   Temp 98.2 F (36.8 C) (Oral)   Resp 15   Ht 5\' 9"  (1.753 m)   Wt 260 lb (117.9 kg)   SpO2 100%   BMI 38.40 kg/m   Physical Exam Vitals reviewed.  Cardiovascular:     Rate and Rhythm: Normal rate and regular rhythm.     Heart sounds: Normal heart sounds.  Pulmonary:     Effort: Pulmonary effort is normal.     Breath sounds: Normal breath sounds.  Abdominal:     Palpations: Abdomen is soft.  Musculoskeletal:        General: Normal range of motion.  Skin:    General: Skin is warm and dry.  Neurological:     Mental Status: He is alert and oriented to person, place, and time.  Psychiatric:        Behavior: Behavior normal.     Imaging: NM PET Image Initial (PI) Skull Base To Thigh  Result Date: 12/02/2019 CLINICAL DATA:  Initial treatment strategy for mediastinal and abdominal lymphadenopathy. EXAM: NUCLEAR MEDICINE PET SKULL BASE TO THIGH TECHNIQUE: 14.2 mCi F-18 FDG was injected intravenously. Full-ring PET imaging was performed from the skull base to thigh after the radiotracer. CT data was obtained and used for attenuation correction and anatomic localization. Fasting blood glucose: 111 mg/dl COMPARISON:  CT scans from 11/07/2019 and 10/02/2019 FINDINGS: Mediastinal blood pool activity: SUV max 2.8 Liver activity: SUV max 3.7 NECK: Asymmetric hypermetabolism  identified in the right vallecula ( SUV max = 7.4) with asymmetry of the soft tissues in this region. 10 mm short axis right level II B lymph node is hypermetabolic with SUV max = 3.3. Incidental CT findings: none CHEST: Upper normal mediastinal lymph nodes are hypermetabolic. 8 mm para-aortic node on 68/4 demonstrates SUV max = 4.7. 10 mm short axis left para-aortic node on 10/2 is hypermetabolic with SUV max = 5.8. No hypermetabolic pulmonary nodule. Incidental CT findings: Aberrant right subclavian artery noted. There is abdominal aortic atherosclerosis without aneurysm. Coronary artery calcification is evident. ABDOMEN/PELVIS: Upper abdominal lymph nodes seen on previous CT show hypermetabolic FDG accumulation. 12 mm node visible on 107/4 has SUV max = 4.6. 16 mm short axis aortocaval  node on 01/30 4/3 demonstrates SUV max = 6.2. 15 mm right common iliac node on 154/4 demonstrates SUV max = 9.9. 14 mm short axis right external iliac node on 180/4 demonstrates SUV max = 6.8. Haziness in the central small bowel mesentery is similar to prior in there are some hypermetabolic lymph nodes in the central mesentery as well. Incidental CT findings: 3-4 mm nonobstructing stone identified interpolar left kidney with additional tiny left rib nonobstructing renal stones. 2.3 cm low-density exophytic lesion interpolar left kidney has attenuation higher than would be expected for a simple cyst but shows no hypermetabolism suggesting cyst complicated by proteinaceous debris or hemorrhage.Diverticular changes noted in the colon without diverticulitis. SKELETON: Mottled FDG accumulation identified within the bony anatomy, non this ossific Incidental CT findings: No worrisome lytic or sclerotic osseous abnormality. IMPRESSION: 1. Hypermetabolic lymph nodes in the chest, abdomen, and pelvis. Differential considerations would include lymphoma and metastatic disease. 2. Nonobstructing left renal stones. 3.  Aortic Atherosclerois  (ICD10-170.0) 4. Electronically Signed   By: Misty Stanley M.D.   On: 12/02/2019 13:45    Labs:  CBC: Recent Labs    11/19/19 1250  WBC 6.3  HGB 14.3  HCT 43.1  PLT 212    COAGS: No results for input(s): INR, APTT in the last 8760 hours.  BMP: Recent Labs    11/19/19 1250  NA 137  K 4.6  CL 104  CO2 25  GLUCOSE 159*  BUN 11  CALCIUM 9.2  CREATININE 0.90  GFRNONAA >60  GFRAA >60    LIVER FUNCTION TESTS: Recent Labs    11/19/19 1250  BILITOT 0.9  AST 20  ALT 21  ALKPHOS 48  PROT 7.3  ALBUMIN 4.4    TUMOR MARKERS: No results for input(s): AFPTM, CEA, CA199, CHROMGRNA in the last 8760 hours.  Assessment and Plan:  Hx Melanoma 20 yrs ago New +PET abdomino-pelvic Lymphadenopathy Now scheduled for LAN biopsy Risks and benefits of Abdomino-pelvic lymph node biopsy was discussed with the patient and/or patient's family including, but not limited to bleeding, infection, damage to adjacent structures or low yield requiring additional tests.  All of the questions were answered and there is agreement to proceed. Consent signed and in chart.  Thank you for this interesting consult.  I greatly enjoyed meeting Kenneth [name] and look forward to participating in their care.  A copy of this report was sent to the requesting provider on this date.  Electronically Signed: Lavonia Drafts, PA-C 12/16/2019, 7:12 AM   I spent a total of  30 Minutes   in face to face in clinical consultation, greater than 50% of which was counseling/coordinating care for abdomino-pelvic LAN bx

## 2019-12-16 NOTE — Procedures (Signed)
Interventional Radiology Procedure Note  Procedure: CT bx right paraaortic node  Complications: None  Estimated Blood Loss: min  Findings: 18 g cores

## 2019-12-16 NOTE — Sedation Documentation (Signed)
Called to give report. Nurse unavailable. Will call back 

## 2019-12-16 NOTE — Discharge Instructions (Signed)
Biopsy Discharge Instructions  The procedure you just had is called a biopsy.  You may feel some discomfort after the local anesthetic wears off.  Your discomfort should improve over the next several days.  AFTER YOUR BIOPSY  Rest for the remainder of the day.  Avoid heavy lifting (more than 10 lb/4.5 kg).  If you have been given a general anesthetic or other medications to help you relax, you should not operate machinery, drive or make legal decisions for 24 hours after your procedure.  Additionally, someone must be available to drive you home.  Only take over-the-counter or prescription medicines for pain, discomfort, or fever as directed by your caregiver.  This can make bleeding worse.  You may resume your usual diet after the procedure.  Avoid alcoholic beverages for 24 hours after your procedure.  Keep the skin around your biopsy site clean and dry.  You may shower after 24 hours.  Cleanse and dry the biopsy site completely after you shower.  Avoid baths and swimming for 72 hours.  Complications are very uncommon after this procedure.  Go to the nearest Emergency Department or contact your caregiver if you develop any of the following symptoms:  Worsening pain  Bleeding  Swelling at the biopsy site  Light headedness or dizziness  Shortness of Breath  Fever or chills Redness or increased pain or swelling at the biopsy site   Moderate Conscious Sedation, Adult, Care After These instructions provide you with information about caring for yourself after your procedure. Your health care provider may also give you more specific instructions. Your treatment has been planned according to current medical practices, but problems sometimes occur. Call your health care provider if you have any problems or questions after your procedure. What can I expect after the procedure? After your procedure, it is common:  To feel sleepy for several hours.  To feel clumsy and have poor balance  for several hours.  To have poor judgment for several hours.  To vomit if you eat too soon. Follow these instructions at home: For at least 24 hours after the procedure:   Do not: ? Participate in activities where you could fall or become injured. ? Drive. ? Use heavy machinery. ? Drink alcohol. ? Take sleeping pills or medicines that cause drowsiness. ? Make important decisions or sign legal documents. ? Take care of children on your own.  Rest. Eating and drinking  Follow the diet recommended by your health care provider.  If you vomit: ? Drink water, juice, or soup when you can drink without vomiting. ? Make sure you have little or no nausea before eating solid foods. General instructions  Have a responsible adult stay with you until you are awake and alert.  Take over-the-counter and prescription medicines only as told by your health care provider.  If you smoke, do not smoke without supervision.  Keep all follow-up visits as told by your health care provider. This is important. Contact a health care provider if:  You keep feeling nauseous or you keep vomiting.  You feel light-headed.  You develop a rash.  You have a fever. Get help right away if:  You have trouble breathing. This information is not intended to replace advice given to you by your health care provider. Make sure you discuss any questions you have with your health care provider. Document Revised: 06/09/2017 Document Reviewed: 10/17/2015 Elsevier Patient Education  2020 Elsevier Inc.  

## 2019-12-19 ENCOUNTER — Telehealth: Payer: Self-pay | Admitting: *Deleted

## 2019-12-19 LAB — SURGICAL PATHOLOGY

## 2019-12-19 NOTE — Telephone Encounter (Signed)
Patient called to ask if Dr.Kale had received biopsy results yet (CT biopsy done on 6/7). Dr.Kale sent referral to ENT - Dr. Lucia Gaskins. Patient has not heard from them at this time - number given for their office. Patient next appt with Dr. Irene Limbo is 6/18. Dr.Kale informed of patient concerns and question.

## 2019-12-21 LAB — SURGICAL PATHOLOGY

## 2019-12-24 ENCOUNTER — Ambulatory Visit (INDEPENDENT_AMBULATORY_CARE_PROVIDER_SITE_OTHER): Payer: Medicare Other | Admitting: Otolaryngology

## 2019-12-24 ENCOUNTER — Encounter (INDEPENDENT_AMBULATORY_CARE_PROVIDER_SITE_OTHER): Payer: Self-pay | Admitting: Otolaryngology

## 2019-12-24 ENCOUNTER — Other Ambulatory Visit: Payer: Self-pay

## 2019-12-24 VITALS — Temp 97.2°F

## 2019-12-24 DIAGNOSIS — D479 Neoplasm of uncertain behavior of lymphoid, hematopoietic and related tissue, unspecified: Secondary | ICD-10-CM

## 2019-12-24 DIAGNOSIS — J387 Other diseases of larynx: Secondary | ICD-10-CM | POA: Diagnosis not present

## 2019-12-24 NOTE — Progress Notes (Signed)
HPI: Kenneth [name] is a 69 y.o. male who presents is referred by Dr Kenneth [name] For evaluation of right base of tongue lesion noted on recent PET scan.  Patient has been found to have abnormal adenopathy in the abdomen and is presently being worked up for a lymphoproliferative disorder.  He underwent a PET scan that demonstrated increased activity in the region of the right base of tongue right vallecula and Dr. Irene [name] referred him here. For further evaluation of this region.  Patient does not smoke or drink.  He denies a sore throat. He had his tonsils and adenoids removed when he was young.  Past Medical History:  Diagnosis Date  . Ascending aortic aneurysm ([place]) 09/04/2018  . Dyspnea 09/03/2018  . Elevated troponin 09/03/2018  . Gout   . Hypercholesteremia   . Hypertension   . Nephrolithiasis   . Palpitations 09/04/2018  . PE (pulmonary thromboembolism) ([place]) 09/04/2018  . Pulmonary embolism [facility])    Past Surgical History:  Procedure Laterality Date  . No prior surgery     Social History   Socioeconomic History  . Marital status: Married    Spouse name: Not on file  . Number of children: Not on file  . Years of education: Not on file  . Highest education level: Not on file  Occupational History  . Not on file  Tobacco Use  . Smoking status: Never Smoker  . Smokeless tobacco: Never Used  Substance and Sexual Activity  . Alcohol use: Never  . Drug use: Never  . Sexual activity: Not on file  Other Topics Concern  . Not on file  Social History Narrative  . Not on file   Social Determinants of Health   Financial Resource Strain:   . Difficulty of Paying Living Expenses:   Food Insecurity:   . Worried About Charity fundraiser in the Last Year:   . Arboriculturist in the Last Year:   Transportation Needs:   . Film/video editor (Medical):   Marland Kitchen Lack of Transportation (Non-Medical):   Physical Activity:   . Days of Exercise per Week:   . Minutes of Exercise per Session:    Stress:   . Feeling of Stress :   Social Connections:   . Frequency of Communication with Friends and Family:   . Frequency of Social Gatherings with Friends and Family:   . Attends Religious Services:   . Active Member of Clubs or Organizations:   . Attends Archivist Meetings:   Marland Kitchen Marital Status:    Family History  Problem Relation Age of Onset  . Diabetes Mother   . Hypertension Mother   . CAD Brother        CABG at age 5   Allergies  Allergen Reactions  . Hctz [Hydrochlorothiazide] Cough   Prior to Admission medications   Medication Sig Start Date End Date Taking? Authorizing Provider  allopurinol (ZYLOPRIM) 100 MG tablet Take 100 mg by mouth 2 (two) times daily.  10/27/17  Yes [provider]  apixaban (ELIQUIS) 5 MG TABS tablet Take 5 mg by mouth 2 (two) times daily.   Yes [provider]  calcium carbonate (TUMS - DOSED IN MG ELEMENTAL CALCIUM) 500 MG chewable tablet Chew 1 tablet by mouth at bedtime as needed for indigestion or heartburn.   Yes [provider]  carvedilol (COREG) 12.5 MG tablet TAKE 1 TABLET BY MOUTH TWICE DAILY WITH A MEAL Patient taking differently: Take 12.5 mg  by mouth 2 (two) times daily with a meal.  10/18/19  Yes Crenshaw, Denice Bors, MD  cloNIDine (CATAPRES) 0.3 MG tablet Take 0.3 mg by mouth daily. 08/11/18  Yes [provider]  finasteride (PROSCAR) 5 MG tablet Take 5 mg by mouth daily. 11/30/17  Yes [provider]  losartan (COZAAR) 100 MG tablet Take 100 mg by mouth daily. 12/20/17  Yes [provider]  rosuvastatin (CRESTOR) 40 MG tablet Take 1 tablet (40 mg total) by mouth daily at 6 PM. 09/05/18  Yes Vann, Jessica U, DO  amLODipine (NORVASC) 10 MG tablet Take 1 tablet (10 mg total) by mouth daily. 09/19/19 12/18/19  Lelon Perla, MD     Positive ROS: Otherwise negative  All other systems have been reviewed and were otherwise negative with the exception of those mentioned in the HPI  and as above.  Physical Exam: Constitutional: Alert, well-appearing, no acute distress Ears: External ears without lesions or tenderness. Ear canals are clear bilaterally with intact, clear TMs.  Nasal: External nose without lesions. Septum slightly deviated to the left with mild rhinitis.. Clear nasal passages with no signs of infection. Oral: Lips and gums without lesions. Tongue and palate mucosa without lesions. Posterior oropharynx clear.  Patient is status post tonsillectomy with normal-appearing oropharynx. Indirect laryngoscopy revealed fullness in the right base of tongue consistent with probable lingual tonsil tissue.  On manual palpation of this it is not firm. Fiberoptic laryngoscopy through the right nostril revealed a clear nasopharynx.  Again noted in the base of tongue on the right side is slight prominence of what appears to be lingual tonsil tissue there is no exudate.  There is no ulceration.  The epiglottis is normal.  AE folds were normal false and true cords are clear and remaining hypopharynx is clear. Neck: No palpable adenopathy or masses on the right side of the neck. Respiratory: Breathing comfortably  Skin: No facial/neck lesions or rash noted.  Laryngoscopy  Date/Time: 12/24/2019 1:39 PM Performed by: Rozetta Nunnery, MD Authorized by: Rozetta Nunnery, MD   Consent:    Consent obtained:  Verbal   Consent given by:  Patient Procedure details:    Indications: direct visualization of the upper aerodigestive tract     Medication:  Afrin   Instrument: flexible fiberoptic laryngoscope     Scope location: right nare   Sinus:    Right nasopharynx: normal   Mouth:    Oropharynx: normal     Epiglottis: normal   Throat:    True vocal cords: normal   Comments:     On fiberoptic laryngoscopy there is area of abnormality noted on the PET scan appears to represent hyperplastic lingual tonsillar tissue.    Assessment: Clinical findings are most  consistent with hyperplastic lingual tonsil tissue.  Does not appear on examination or palpation to represent base of tongue cancer.  But the lingual tonsil tissue on the right side of the base of tongue is more prominent than the left side.  But on palpation is not indurated.  Plan: This could be part of the lymphoproliferative disorder patient is experiencing. This could be biopsied but would require general anesthesia.  I could also recheck a man 2 to 3 months to make sure it is not enlarging. I will plan on discussing this with Dr. Bary Richard, MD   CC:

## 2019-12-26 NOTE — Progress Notes (Signed)
HEMATOLOGY/ONCOLOGY CONSULTATION NOTE  Date of Service: 12/27/2019  Patient Care Team: [name], MD as PCP - General (Internal Medicine) Stanford Breed Denice Bors, MD as PCP - Cardiology (Cardiology)  CHIEF COMPLAINTS/PURPOSE OF CONSULTATION:  Lymphadenopathy  HISTORY OF PRESENTING ILLNESS:  Kenneth [name] is a wonderful 69 y.o. male who has been referred to [place] by Dr Wyatt Portela for evaluation and management of lymphadenopathy. The patient's last visit with [place] was on 12/06/19. The pt reports that he is doing well overall.  The pt reports he is good. He has seen Dr. Radene Journey. Pt has been having night sweats over the last couple of months. He has gradually gotten more fatigue over the last couple of years. Pt does not smoke, use alcohol and use drugs.   Of note since the patient's last visit, pt has had CT Biopsy ([id number]) completed on 12/16/19 with results revealing "Successful CT-guided core biopsy of the right retroperitoneal caval adenopathy" Pt had Surgical Pathology [phone]) completed on 12/16/19 with results revealing "A. LYMPH NODE, RIGHT PARA AORTIC, NEEDLE CORE BIOPSY:  Atypical lymphoid proliferation  See comment. Overall, the findings are concerning for a marginal zone  lymphoma with follicular colonization but follicular lymphoma also remains in the differential given the limited material for examination.  "  Lab results today (12/16/19) of CBC w/diff and CMP is as follows: all values are WNL  12/16/19 of Protime-INR is as follows: all values are WNL  On review of systems, pt reports night sweats and denies fevers, fatigue, weight changes, abdominal pain, and any other symptoms.   MEDICAL HISTORY:  Past Medical History:  Diagnosis Date  . Ascending aortic aneurysm ([place]) 09/04/2018  . Dyspnea 09/03/2018  . Elevated troponin 09/03/2018  . Gout   . Hypercholesteremia   . Hypertension   . Nephrolithiasis   . Palpitations 09/04/2018  . PE (pulmonary thromboembolism)  ([place]) 09/04/2018  . Pulmonary embolism ([place])     SURGICAL HISTORY: Past Surgical History:  Procedure Laterality Date  . No prior surgery      SOCIAL HISTORY: Social History   Socioeconomic History  . Marital status: Married    Spouse name: Not on file  . Number of children: Not on file  . Years of education: Not on file  . Highest education level: Not on file  Occupational History  . Not on file  Tobacco Use  . Smoking status: Never Smoker  . Smokeless tobacco: Never Used  Substance and Sexual Activity  . Alcohol use: Never  . Drug use: Never  . Sexual activity: Not on file  Other Topics Concern  . Not on file  Social History Narrative  . Not on file   Social Determinants of Health   Financial Resource Strain:   . Difficulty of Paying Living Expenses:   Food Insecurity:   . Worried About Charity fundraiser in the Last Year:   . Arboriculturist in the Last Year:   Transportation Needs:   . Film/video editor (Medical):   Marland Kitchen Lack of Transportation (Non-Medical):   Physical Activity:   . Days of Exercise per Week:   . Minutes of Exercise per Session:   Stress:   . Feeling of Stress :   Social Connections:   . Frequency of Communication with Friends and Family:   . Frequency of Social Gatherings with Friends and Family:   . Attends Religious Services:   . Active Member of Clubs or Organizations:   . Attends  Club or Organization Meetings:   . Marital Status:   Intimate Partner Violence:   . Fear of Current or Ex-Partner:   . Emotionally Abused:   . Physically Abused:   . Sexually Abused:     FAMILY HISTORY: Family History  Problem Relation Age of Onset  . Diabetes Mother   . Hypertension Mother   . CAD Brother        CABG at age 70    ALLERGIES:  is allergic to hctz [hydrochlorothiazide].  MEDICATIONS:  Current Outpatient Medications  Medication Sig Dispense Refill  . allopurinol (ZYLOPRIM) 100 MG tablet Take 100 mg by mouth 2 (two) times  daily.   3  . apixaban (ELIQUIS) 5 MG TABS tablet Take 5 mg by mouth 2 (two) times daily.    . calcium carbonate (TUMS - DOSED IN MG ELEMENTAL CALCIUM) 500 MG chewable tablet Chew 1 tablet by mouth at bedtime as needed for indigestion or heartburn.    . carvedilol (COREG) 12.5 MG tablet TAKE 1 TABLET BY MOUTH TWICE DAILY WITH A MEAL (Patient taking differently: Take 12.5 mg by mouth 2 (two) times daily with a meal. ) 180 tablet 3  . cloNIDine (CATAPRES) 0.3 MG tablet Take 0.3 mg by mouth daily.    . finasteride (PROSCAR) 5 MG tablet Take 5 mg by mouth daily.  2  . losartan (COZAAR) 100 MG tablet Take 100 mg by mouth daily.  5  . rosuvastatin (CRESTOR) 40 MG tablet Take 1 tablet (40 mg total) by mouth daily at 6 PM. 30 tablet 0  . amLODipine (NORVASC) 10 MG tablet Take 1 tablet (10 mg total) by mouth daily. 90 tablet 3   No current facility-administered medications for this visit.    REVIEW OF SYSTEMS:   A 10+ POINT REVIEW OF SYSTEMS WAS OBTAINED including neurology, dermatology, psychiatry, cardiac, respiratory, lymph, extremities, GI, GU, Musculoskeletal, constitutional, breasts, reproductive, HEENT.  All pertinent positives are noted in the HPI.  All others are negative.   PHYSICAL EXAMINATION: ECOG PERFORMANCE STATUS: 1 - Symptomatic but completely ambulatory  . Vitals:   12/27/19 1029  BP: (!) 141/85  Pulse: 88  Resp: 18  Temp: 97.9 F (36.6 C)  SpO2: 96%   Filed Weights   12/27/19 1029  Weight: 259 lb 1.6 oz (117.5 kg)   .Body mass index is 38.26 kg/m.   GENERAL:alert, in no acute distress and comfortable SKIN: no acute rashes, no significant lesions EYES: conjunctiva are pink and non-injected, sclera anicteric OROPHARYNX: MMM, no exudates, no oropharyngeal erythema or ulceration NECK: supple, no JVD LYMPH:  no palpable lymphadenopathy in the cervical, axillary or inguinal regions LUNGS: clear to auscultation b/l with normal respiratory effort HEART: regular rate &  rhythm ABDOMEN:  normoactive bowel sounds , non tender, not distended. Extremity: minimal pedal edema PSYCH: alert & oriented x 3 with fluent speech NEURO: no focal motor/sensory deficits  LABORATORY DATA:  I have reviewed the data as listed  . CBC Latest Ref Rng & Units 12/16/2019 11/19/2019 09/05/2018  WBC 4.0 - 10.5 K/uL 6.7 6.3 7.0  Hemoglobin 13.0 - 17.0 g/dL 14.4 14.3 13.5  Hematocrit 39 - 52 % 43.2 43.1 41.5  Platelets 150 - 400 K/uL 254 212 215    . CMP Latest Ref Rng & Units 11/19/2019 09/05/2018 09/04/2018  Glucose 70 - 99 mg/dL 159(H) 114(H) 114(H)  BUN 8 - 23 mg/dL 11 11 7(L)  Creatinine 0.61 - 1.24 mg/dL 0.90 0.93 0.93  Sodium 135 - 145   mmol/L 137 138 137  Potassium 3.5 - 5.1 mmol/L 4.6 3.6 3.8  Chloride 98 - 111 mmol/L 104 103 103  CO2 22 - 32 mmol/L 25 25 22  Calcium 8.9 - 10.3 mg/dL 9.2 8.9 8.9  Total Protein 6.5 - 8.1 g/dL 7.3 - 6.9  Total Bilirubin 0.3 - 1.2 mg/dL 0.9 - 1.1  Alkaline Phos 38 - 126 U/L 48 - 47  AST 15 - 41 U/L 20 - 34  ALT 0 - 44 U/L 21 - 29   12/16/19 of Surgical Pathology ([id number])  RADIOGRAPHIC STUDIES: I have personally reviewed the radiological images as listed and agreed with the findings in the report. NM PET Image Initial (PI) Skull Base To Thigh  Result Date: 12/02/2019 CLINICAL DATA:  Initial treatment strategy for mediastinal and abdominal lymphadenopathy. EXAM: NUCLEAR MEDICINE PET SKULL BASE TO THIGH TECHNIQUE: 14.2 mCi F-18 FDG was injected intravenously. Full-ring PET imaging was performed from the skull base to thigh after the radiotracer. CT data was obtained and used for attenuation correction and anatomic localization. Fasting blood glucose: 111 mg/dl COMPARISON:  CT scans from 11/07/2019 and 10/02/2019 FINDINGS: Mediastinal blood pool activity: SUV max 2.8 Liver activity: SUV max 3.7 NECK: Asymmetric hypermetabolism identified in the right vallecula ( SUV max = 7.4) with asymmetry of the soft tissues in this region. 10 mm  short axis right level II B lymph node is hypermetabolic with SUV max = 3.3. Incidental CT findings: none CHEST: Upper normal mediastinal lymph nodes are hypermetabolic. 8 mm para-aortic node on 68/4 demonstrates SUV max = 4.7. 10 mm short axis left para-aortic node on 62/4 is hypermetabolic with SUV max = 5.8. No hypermetabolic pulmonary nodule. Incidental CT findings: Aberrant right subclavian artery noted. There is abdominal aortic atherosclerosis without aneurysm. Coronary artery calcification is evident. ABDOMEN/PELVIS: Upper abdominal lymph nodes seen on previous CT show hypermetabolic FDG accumulation. 12 mm node visible on 107/4 has SUV max = 4.6. 16 mm short axis aortocaval node on 01/30 4/3 demonstrates SUV max = 6.2. 15 mm right common iliac node on 154/4 demonstrates SUV max = 9.9. 14 mm short axis right external iliac node on 180/4 demonstrates SUV max = 6.8. Haziness in the central small bowel mesentery is similar to prior in there are some hypermetabolic lymph nodes in the central mesentery as well. Incidental CT findings: 3-4 mm nonobstructing stone identified interpolar left kidney with additional tiny left rib nonobstructing renal stones. 2.3 cm low-density exophytic lesion interpolar left kidney has attenuation higher than would be expected for a simple cyst but shows no hypermetabolism suggesting cyst complicated by proteinaceous debris or hemorrhage.Diverticular changes noted in the colon without diverticulitis. SKELETON: Mottled FDG accumulation identified within the bony anatomy, non this ossific Incidental CT findings: No worrisome lytic or sclerotic osseous abnormality. IMPRESSION: 1. Hypermetabolic lymph nodes in the chest, abdomen, and pelvis. Differential considerations would include lymphoma and metastatic disease. 2. Nonobstructing left renal stones. 3.  Aortic Atherosclerois (ICD10-170.0) 4. Electronically Signed   By: Eric  Mansell M.D.   On: 12/02/2019 13:45   CT Biopsy  Result  Date: 12/16/2019 INDICATION: Hypermetabolic abdominopelvic adenopathy concerning for lymphoma EXAM: CT-GUIDED BIOPSY RIGHT RETROPERITONEAL PERICAVAL ADENOPATHY MEDICATIONS: 1% lidocaine local ANESTHESIA/SEDATION: 2.0 mg IV Versed; 100 mcg IV Fentanyl Moderate Sedation Time:  15 minutes The patient was continuously monitored during the procedure by the interventional radiology nurse under my direct supervision. PROCEDURE: The procedure, risks, benefits, and alternatives were explained to the patient. Questions regarding the procedure were encouraged   and answered. The patient understands and consents to the procedure. Previous imaging reviewed. Patient positioned prone. Noncontrast localization CT performed. The right retroperitoneal pericaval adenopathy was localized and marked for a posterior approach. Under sterile conditions and local anesthesia, a 17 gauge guide needle was advanced to the lymph node. Needle position confirmed with CT. 18 gauge core biopsies obtained. Samples placed in saline. Postprocedure imaging demonstrates a trace amount of adjacent retroperitoneal hemorrhage. Patient tolerated the procedure well without complication. Vital sign monitoring by nursing staff during the procedure will continue as patient is in the special procedures unit for post procedure observation. FINDINGS: The images document guide needle placement within the right retroperitoneal caval adenopathy. Post biopsy images demonstrate trace adjacent hemorrhage. COMPLICATIONS: None immediate. IMPRESSION: Successful CT-guided core biopsy of the right retroperitoneal caval adenopathy Electronically Signed   By: M.  Shick M.D.   On: 12/16/2019 09:52   CTA C/A/P 10/02/2019: IMPRESSION: 1. Stable aneurysmal dilatation of the ascending thoracic aorta. Recommend annual imaging followup by CTA or MRA. This recommendation follows 2010 ACCF/AHA/AATS/ACR/ASA/SCA/SCAI/SIR/STS/SVM Guidelines for the Diagnosis and Management of Patients  with Thoracic Aortic Disease. Circulation. 2010; 121: E266-e369. Aortic aneurysm NOS (ICD10-I71.9) 2. Interval development of mediastinal and upper abdominal lymphadenopathy. Please correlate with any history of lymphoma or leukemia. Dedicated CT of the abdomen and pelvis may be useful to evaluate the extent of the abdominal adenopathy. 3. No evidence of pulmonary emboli on this exam. Resolution of the segmental lower lobe emboli seen previously.  These results will be called to the ordering clinician or representative by the Radiologist Assistant, and communication documented in the PACS or [facility] Dashboard.   Electronically Signed   By: Michael  Brown M.D.   On: 10/02/2019 17:00   ASSESSMENT & PLAN:   68 yo with   1) Generalized lymphadenopathy in the mediastinum and throughout abdomen and pelvis-  Bx ssuggesting low grade NHL -- likely Marginal zone lymphoma vs FL PLAN: -Discussed pt labwork today, 12/16/19; of CBC w/diff and CMP is as follows: all values are WNL  -Discussed 12/16/19 of Protime-INR is as follows: all values are WNL -Discussed 12/16/19 of CT Biopsy ([id number]) and Surgical Pathology ([id number])- showing low grade NHL -Advised seeing lymph nodes above and below diaphragm -Advised on Low Grade Non-Hodgkin's Lymphoma -marginal zone lymphoma or follicular lymphoma  -Advised no clear evidence of primary tumor in organs -no signs of carcinoma  -Advised on treatment- symptomatic, blood count abnormalities, organs that are threatened  -Advised this is a indolent lymphoma  -Advised to complete staging- stage 3- no obvious involvement outside lymph nodes -Advised on bone marrow biopsy  -Advised on timing and indications of treatment  -Advised on additional biopsy -rt vallecula thickening- would need to be put under general anesthesia -Advised rt vallecula thickening could be reactive or a part of different process  -Advised on guidelines of watching for 4-6  months with scans vs additional biopsies to be more certain of staging  -Recommended that the pt continue to eat well, drink at least 48-64 oz of water each day, and walk 20-30 minutes each day.  -Recommend using compression socks and keeping feet up  -F/u with Dr Chris Newman ENT for biopsy  -Will get CT scan in 6 months  -Will see back in 3 months with labs  FOLLOW UP: RTC with Dr Kale with labs in 3 months  . Orders Placed This Encounter  Procedures  . CBC with Differential/Platelet    Standing Status:   Future      Standing Expiration Date:   01/01/2021  . CMP (Cancer Center only)    Standing Status:   Future    Standing Expiration Date:   01/01/2021  . Lactate dehydrogenase    Standing Status:   Future    Standing Expiration Date:   01/01/2021     The total time spent in the appt was 60 minutes and more than 50% was on counseling and direct patient cares.  All of the patient's questions were answered with apparent satisfaction. The patient knows to call the clinic with any problems, questions or concerns.  Gautam Kale MD MS AAHIVMS SCH CTH Hematology/Oncology Physician [facility]  (Office):       [phone] (Work cell):  [phone] (Fax):           [phone]  12/27/2019 11:30 AM  I, Elaine Hinkel am acting as a scribe for Dr. Gautam Kale.   .I have reviewed the above documentation for accuracy and completeness, and I agree with the above. .Gautam Kishore Kale MD       

## 2019-12-27 ENCOUNTER — Telehealth: Payer: Self-pay | Admitting: Hematology

## 2019-12-27 ENCOUNTER — Inpatient Hospital Stay: Payer: Medicare Other | Attending: Hematology | Admitting: Hematology

## 2019-12-27 ENCOUNTER — Other Ambulatory Visit: Payer: Self-pay

## 2019-12-27 VITALS — BP 141/85 | HR 88 | Temp 97.9°F | Resp 18 | Ht 69.0 in | Wt 259.1 lb

## 2019-12-27 DIAGNOSIS — R5383 Other fatigue: Secondary | ICD-10-CM | POA: Insufficient documentation

## 2019-12-27 DIAGNOSIS — Z86711 Personal history of pulmonary embolism: Secondary | ICD-10-CM | POA: Diagnosis not present

## 2019-12-27 DIAGNOSIS — Z7901 Long term (current) use of anticoagulants: Secondary | ICD-10-CM | POA: Diagnosis not present

## 2019-12-27 DIAGNOSIS — E7800 Pure hypercholesterolemia, unspecified: Secondary | ICD-10-CM | POA: Insufficient documentation

## 2019-12-27 DIAGNOSIS — R591 Generalized enlarged lymph nodes: Secondary | ICD-10-CM | POA: Insufficient documentation

## 2019-12-27 DIAGNOSIS — I10 Essential (primary) hypertension: Secondary | ICD-10-CM | POA: Diagnosis not present

## 2019-12-27 DIAGNOSIS — C8580 Other specified types of non-Hodgkin lymphoma, unspecified site: Secondary | ICD-10-CM

## 2019-12-27 NOTE — Patient Instructions (Signed)
Thank you for choosing [facility] to provide your oncology and hematology care.   Should you have questions after your visit to the [facility] ([facility]), please contact this office at [phone] between 8:30 AM and 4:30 PM.  Voice mails left after 4:00 PM may not be returned until the following business day.  Calls received after 4:30 PM will be answered by an off-site Nurse Triage Line.    Prescription Refills:  Please have your pharmacy contact us directly for most prescription requests.  Contact the office directly for refills of narcotics (pain medications). Allow 48-72 hours for refills.  Appointments: Please contact the [facility] scheduling department [phone] for questions regarding [facility] appointment scheduling.  Contact the schedulers with any scheduling changes so that your appointment can be rescheduled in a timely manner.   Central Scheduling for [place] [phone] - Call to schedule procedures such as PET scans, CT scans, MRI, Ultrasound, etc.  To afford each patient quality time with our providers, please arrive 30 minutes before your scheduled appointment time.  If you arrive late for your appointment, you may be asked to reschedule.  We strive to give you quality time with our providers, and arriving late affects you and other patients whose appointments are after yours. If you are a no show for multiple scheduled visits, you may be dismissed from the clinic at the providers discretion.     Resources: [facility] Social Workers [phone] for additional information on assistance programs or assistance connecting with community support programs   [place] DSS  336-641-3447: Information regarding food stamps, Medicaid, and utility assistance [place]AT 336-333-6589   [place] [employer]'s shared-ride transportation service for eligible riders who have a disability that prevents them from riding the fixed route bus.   Medicare Rights Center  800-333-4114 Helps people with Medicare understand their rights and benefits, navigate the Medicare system, and secure the quality healthcare they deserve [employer] 800-227-2345 Assists patients locate various types of support and financial assistance Cancer Care: 1-800-813-HOPE (4673) Provides financial assistance, online support groups, medication/co-pay assistance.   Transportation Assistance for appointments at [facility]: Transportation Coordinator [phone]  Again, thank you for choosing  Cancer Center for your care.       

## 2019-12-27 NOTE — Telephone Encounter (Signed)
Scheduled per 6/18 los. Printed avs and calendar for pt. Pt is aware of appts 

## 2020-01-02 ENCOUNTER — Telehealth: Payer: Self-pay | Admitting: Hematology

## 2020-01-02 NOTE — Telephone Encounter (Signed)
Scheduled per los, patient has been called and voicemail was left. 

## 2020-02-13 ENCOUNTER — Other Ambulatory Visit: Payer: Self-pay

## 2020-02-13 ENCOUNTER — Ambulatory Visit (INDEPENDENT_AMBULATORY_CARE_PROVIDER_SITE_OTHER): Payer: Medicare Other | Admitting: Otolaryngology

## 2020-02-13 VITALS — Temp 97.5°F

## 2020-02-13 DIAGNOSIS — J029 Acute pharyngitis, unspecified: Secondary | ICD-10-CM

## 2020-02-13 DIAGNOSIS — C8593 Non-Hodgkin lymphoma, unspecified, intra-abdominal lymph nodes: Secondary | ICD-10-CM | POA: Diagnosis not present

## 2020-02-13 NOTE — Progress Notes (Signed)
HPI: Kenneth [name] is a 69 y.o. male who returns today for evaluation of abnormality noted in the region of the right base of tongue on PET scan performed in May of this year.  He has subsequently been diagnosed with non-Hodgkin's lymphoma and is followed by Dr. Irene Limbo.  I had seen him previously 6 weeks ago concerning the abnormality noted on the PET scan and this appeared to be lingual tonsil tissue.  Patient had his tonsils removed when he was young. He denies any sore throat. He presents today for recheck.  Past Medical History:  Diagnosis Date  . Ascending aortic aneurysm ([place]) 09/04/2018  . Dyspnea 09/03/2018  . Elevated troponin 09/03/2018  . Gout   . Hypercholesteremia   . Hypertension   . Nephrolithiasis   . Palpitations 09/04/2018  . PE (pulmonary thromboembolism) ([place]) 09/04/2018  . Pulmonary embolism [facility])    Past Surgical History:  Procedure Laterality Date  . No prior surgery     Social History   Socioeconomic History  . Marital status: Married    Spouse name: Not on file  . Number of children: Not on file  . Years of education: Not on file  . Highest education level: Not on file  Occupational History  . Not on file  Tobacco Use  . Smoking status: Never Smoker  . Smokeless tobacco: Never Used  Substance and Sexual Activity  . Alcohol use: Never  . Drug use: Never  . Sexual activity: Not on file  Other Topics Concern  . Not on file  Social History Narrative  . Not on file   Social Determinants of Health   Financial Resource Strain:   . Difficulty of Paying Living Expenses:   Food Insecurity:   . Worried About Charity fundraiser in the Last Year:   . Arboriculturist in the Last Year:   Transportation Needs:   . Film/video editor (Medical):   Marland Kitchen Lack of Transportation (Non-Medical):   Physical Activity:   . Days of Exercise per Week:   . Minutes of Exercise per Session:   Stress:   . Feeling of Stress :   Social Connections:   . Frequency of  Communication with Friends and Family:   . Frequency of Social Gatherings with Friends and Family:   . Attends Religious Services:   . Active Member of Clubs or Organizations:   . Attends Archivist Meetings:   Marland Kitchen Marital Status:    Family History  Problem Relation Age of Onset  . Diabetes Mother   . Hypertension Mother   . CAD Brother        CABG at age 36   Allergies  Allergen Reactions  . Hctz [Hydrochlorothiazide] Cough   Prior to Admission medications   Medication Sig Start Date End Date Taking? Authorizing Provider  allopurinol (ZYLOPRIM) 100 MG tablet Take 100 mg by mouth 2 (two) times daily.  10/27/17  Yes [provider]  apixaban (ELIQUIS) 5 MG TABS tablet Take 5 mg by mouth 2 (two) times daily.   Yes [provider]  calcium carbonate (TUMS - DOSED IN MG ELEMENTAL CALCIUM) 500 MG chewable tablet Chew 1 tablet by mouth at bedtime as needed for indigestion or heartburn.   Yes [provider]  carvedilol (COREG) 12.5 MG tablet TAKE 1 TABLET BY MOUTH TWICE DAILY WITH A MEAL Patient taking differently: Take 12.5 mg by mouth 2 (two) times daily with a meal.  10/18/19  Yes  Lelon Perla, MD  cloNIDine (CATAPRES) 0.3 MG tablet Take 0.3 mg by mouth daily. 08/11/18  Yes [provider]  finasteride (PROSCAR) 5 MG tablet Take 5 mg by mouth daily. 11/30/17  Yes [provider]  losartan (COZAAR) 100 MG tablet Take 100 mg by mouth daily. 12/20/17  Yes [provider]  rosuvastatin (CRESTOR) 40 MG tablet Take 1 tablet (40 mg total) by mouth daily at 6 PM. 09/05/18  Yes Vann, Jessica U, DO  amLODipine (NORVASC) 10 MG tablet Take 1 tablet (10 mg total) by mouth daily. 09/19/19 12/18/19  Lelon Perla, MD     Positive ROS: Otherwise negative  All other systems have been reviewed and were otherwise negative with the exception of those mentioned in the HPI and as above.  Physical Exam: Constitutional: Alert, well-appearing, no  acute distress Ears: External ears without lesions or tenderness. Ear canals are clear bilaterally with intact, clear TMs.  Nasal: External nose without lesions.. Clear nasal passages bilaterally. Oral: Lips and gums without lesions. Tongue and palate mucosa without lesions. Posterior oropharynx clear.  Patient is status post tonsillectomy. Indirect laryngoscopy revealed mildly prominent lingual tonsil tissue slightly worse on the right side compared to the left but no ulcerations and no exudate.  Palpation of the right tonsil region and right base of tongue was soft to palpation with no discrete masses or firmness palpated. Fiberoptic laryngoscopy revealed clear base of tongue with no gross abnormality noted.  Epiglottis AE folds and vocal cords were clear. Neck: No palpable adenopathy or masses.  There is no palpable adenopathy on the right side of the neck. Respiratory: Breathing comfortably  Skin: No facial/neck lesions or rash noted.  Laryngoscopy  Date/Time: 02/13/2020 5:16 PM Performed by: Rozetta Nunnery, MD Authorized by: Rozetta Nunnery, MD   Consent:    Consent obtained:  Verbal   Consent given by:  Patient Procedure details:    Indications: direct visualization of the upper aerodigestive tract     Medication:  Afrin   Instrument: flexible fiberoptic laryngoscope     Scope location: left nare   Sinus:    Left nasopharynx: normal   Mouth:    Oropharynx: normal     Vallecula: normal     Base of tongue: normal     Epiglottis: normal   Throat:    True vocal cords: normal   Comments:     On fiberoptic laryngoscopy the vallecula and base of tongue were clear to evaluation with mildly prominent lingual tonsil tissue that was soft to palpation.    Assessment: The increased metabolic activity noted on PET scan in May appears to represent lingual tonsil tissue with no clinical evidence of base of tongue cancer or hypopharyngeal cancer.  Plan: Recommended follow-up  with Dr. Irene Limbo concerning treatment of his non-Hodgkin's lymphoma.  I discussed with him that the abnormality on the PET scan was consistent with benign appearing lingual tonsil tissue.   Radene Journey, MD

## 2020-03-30 ENCOUNTER — Inpatient Hospital Stay: Payer: Medicare Other | Attending: Hematology | Admitting: Hematology

## 2020-03-30 ENCOUNTER — Inpatient Hospital Stay: Payer: Medicare Other

## 2020-03-30 ENCOUNTER — Other Ambulatory Visit: Payer: Self-pay

## 2020-03-30 VITALS — BP 153/75 | HR 61 | Temp 98.4°F | Resp 18 | Ht 69.0 in | Wt 259.6 lb

## 2020-03-30 DIAGNOSIS — R5383 Other fatigue: Secondary | ICD-10-CM | POA: Insufficient documentation

## 2020-03-30 DIAGNOSIS — I10 Essential (primary) hypertension: Secondary | ICD-10-CM | POA: Diagnosis not present

## 2020-03-30 DIAGNOSIS — Z08 Encounter for follow-up examination after completed treatment for malignant neoplasm: Secondary | ICD-10-CM | POA: Diagnosis not present

## 2020-03-30 DIAGNOSIS — R600 Localized edema: Secondary | ICD-10-CM | POA: Diagnosis not present

## 2020-03-30 DIAGNOSIS — E7800 Pure hypercholesterolemia, unspecified: Secondary | ICD-10-CM | POA: Diagnosis not present

## 2020-03-30 DIAGNOSIS — C8580 Other specified types of non-Hodgkin lymphoma, unspecified site: Secondary | ICD-10-CM

## 2020-03-30 DIAGNOSIS — Z7901 Long term (current) use of anticoagulants: Secondary | ICD-10-CM | POA: Insufficient documentation

## 2020-03-30 DIAGNOSIS — R42 Dizziness and giddiness: Secondary | ICD-10-CM | POA: Insufficient documentation

## 2020-03-30 DIAGNOSIS — R591 Generalized enlarged lymph nodes: Secondary | ICD-10-CM | POA: Insufficient documentation

## 2020-03-30 DIAGNOSIS — M549 Dorsalgia, unspecified: Secondary | ICD-10-CM | POA: Diagnosis not present

## 2020-03-30 DIAGNOSIS — Z8572 Personal history of non-Hodgkin lymphomas: Secondary | ICD-10-CM | POA: Insufficient documentation

## 2020-03-30 DIAGNOSIS — I712 Thoracic aortic aneurysm, without rupture: Secondary | ICD-10-CM | POA: Diagnosis not present

## 2020-03-30 DIAGNOSIS — Z86711 Personal history of pulmonary embolism: Secondary | ICD-10-CM | POA: Diagnosis not present

## 2020-03-30 LAB — CBC WITH DIFFERENTIAL/PLATELET
Abs Immature Granulocytes: 0.01 10*3/uL (ref 0.00–0.07)
Basophils Absolute: 0 10*3/uL (ref 0.0–0.1)
Basophils Relative: 1 %
Eosinophils Absolute: 0.3 10*3/uL (ref 0.0–0.5)
Eosinophils Relative: 5 %
HCT: 41.3 % (ref 39.0–52.0)
Hemoglobin: 13.8 g/dL (ref 13.0–17.0)
Immature Granulocytes: 0 %
Lymphocytes Relative: 20 %
Lymphs Abs: 1.3 10*3/uL (ref 0.7–4.0)
MCH: 28 pg (ref 26.0–34.0)
MCHC: 33.4 g/dL (ref 30.0–36.0)
MCV: 83.9 fL (ref 80.0–100.0)
Monocytes Absolute: 0.8 10*3/uL (ref 0.1–1.0)
Monocytes Relative: 12 %
Neutro Abs: 3.9 10*3/uL (ref 1.7–7.7)
Neutrophils Relative %: 62 %
Platelets: 220 10*3/uL (ref 150–400)
RBC: 4.92 MIL/uL (ref 4.22–5.81)
RDW: 12.8 % (ref 11.5–15.5)
WBC: 6.3 10*3/uL (ref 4.0–10.5)
nRBC: 0 % (ref 0.0–0.2)

## 2020-03-30 LAB — CMP (CANCER CENTER ONLY)
ALT: 26 U/L (ref 0–44)
AST: 22 U/L (ref 15–41)
Albumin: 4 g/dL (ref 3.5–5.0)
Alkaline Phosphatase: 48 U/L (ref 38–126)
Anion gap: 6 (ref 5–15)
BUN: 9 mg/dL (ref 8–23)
CO2: 29 mmol/L (ref 22–32)
Calcium: 9.3 mg/dL (ref 8.9–10.3)
Chloride: 103 mmol/L (ref 98–111)
Creatinine: 0.89 mg/dL (ref 0.61–1.24)
GFR, Est AFR Am: >60 mL/min (ref >60)
GFR, Estimated: >60 mL/min (ref >60)
Glucose, Bld: 120 mg/dL — ABNORMAL HIGH (ref 70–99)
Potassium: 4.8 mmol/L (ref 3.5–5.1)
Sodium: 138 mmol/L (ref 135–145)
Total Bilirubin: 0.9 mg/dL (ref 0.3–1.2)
Total Protein: 7 g/dL (ref 6.5–8.1)

## 2020-03-30 LAB — LACTATE DEHYDROGENASE: LDH: 174 U/L (ref 98–192)

## 2020-03-30 NOTE — Progress Notes (Signed)
HEMATOLOGY/ONCOLOGY CLINIC NOTE  Date of Service: 03/30/2020  Patient Care Team: [name], MD as PCP - General (Internal Medicine) Stanford Breed Denice Bors, MD as PCP - Cardiology (Cardiology)  CHIEF COMPLAINTS/PURPOSE OF CONSULTATION:  Lymphadenopathy  HISTORY OF PRESENTING ILLNESS:  Kenneth [name] is a wonderful 69 y.o. male who has been referred to [place] by Dr Wyatt Portela for evaluation and management of lymphadenopathy. The pt reports that he is doing well overall.   The pt reports that he began to have some back discomfort the day after his CT scan, but has otherwise felt well over the last 6 months. When he stands up, lays down, or walks he does not experience the pain. When he sits for a long period of time or slouches it begins to hurt. He denies any recent changes in his urine or urinary habits. He has previously had kidney stones. Pt also reports overall fatigue, but thinks that this is best explained by his advanced age. He has not had any recent infections. Pt has a cat that he sometimes sleeps in close quarters with. He received his COVID19 vaccine after his scan on 03/24, but prior to his scan on 04/29.  Pt does have some lower leg swelling in both legs which he attributes to weight gain. Pt has had leg swelling in the past during a Gout attack. Pt had PE in 02/20, for which no cause has been found, but there was concern that a blood clot from his lower extremities traveled to his lungs. He has continued using anti-coagulation therapy as prescribed. Pt had a localized Melanoma on his right shoulder that was removed 20 years ago. He was seen at [facility] and received a short series of injections. He last saw his Dermatologist three years ago and there were no worries of recurrence. Pt does have occasional lightheadedness, about once per week, that is improved after eating. About 3-4 years ago he was having episodes where he felt that he had to focus on breathing because he was not breathing  automatically. Pt has had an epididymal cyst for several years that has been unchanging.   Of note prior to the patient's visit today, pt has had CT Abd/Pel ([id number]) completed on 11/07/2019 with results revealing "Mild lymphadenopathy throughout the abdomen and pelvis, new since 2011 exam. Suggest correlation for clinical/laboratory evidence of lymphoproliferative disorder, and consider short-term follow-up by CT in 3 months. Stable small benign left adrenal adenoma. Left nephrolithiasis. No evidence of ureteral calculi or hydronephrosis."   Most recent lab results (10/18/2019) of CBC w/diff and CMP is as follows: all values are WNL except for RDW at 40.3, Glucose at 117.  On review of systems, pt reports lower leg swelling, lightheadedness, back pain, fatigue and denies dysuria, hematuria, discolored urine, polyuria, fevers, chills, night sweats, unexpected weight loss, loss of appetite, diarrhea, constipation, abdominal pain, testicular pain/swelling and any other symptoms.   On PMHx the pt reports PE, Palpitations, HTN, Hypercholesterolemia, Ascending Aortic Aneurysm, Gout, Melanoma. On Social Hx the pt reports that he does not smoke cigarettes and does not drink alcohol.    INTERVAL HISTORY: Kenneth [name] is a wonderful 69 y.o. male who is here for evaluation and management of low-grade Non-Hodgkin's Lymphoma. The patient's last visit with [place] was on 12/27/2019. The pt reports that he is doing well overall.  The pt reports that he has been seen by Dr. Lucia Gaskins, who thought that it was lingual tonsil tissue noted on the PET scan. He did  not suggest a biopsy at this time. Pt has been experiencing some lightheadedness and feet swelling. Pt reduced his dosage of Amlodipine. He feels that his blood pressure has been stable even after reducing his medication dose. He has a follow up with his PCP on 04/24/20.   Lab results today (03/30/20) of CBC w/diff and CMP is as follows: all values are WNL  except for Glucose at 120. 03/30/2020 LDH at 174  On review of systems, pt reports lightheadedness, leg swelling and denies fevers, chills, night sweats, unexpected weight loss, SOB, new lumps/bumps, bowel habit changes, dysphagia, vocal changes, abdominal pain, back pain and any other symptoms.   MEDICAL HISTORY:  Past Medical History:  Diagnosis Date  . Ascending aortic aneurysm ([place]) 09/04/2018  . Dyspnea 09/03/2018  . Elevated troponin 09/03/2018  . Gout   . Hypercholesteremia   . Hypertension   . Nephrolithiasis   . Palpitations 09/04/2018  . PE (pulmonary thromboembolism) ([place]) 09/04/2018  . Pulmonary embolism ([place])     SURGICAL HISTORY: Past Surgical History:  Procedure Laterality Date  . No prior surgery      SOCIAL HISTORY: Social History   Socioeconomic History  . Marital status: Married    Spouse name: Not on file  . Number of children: Not on file  . Years of education: Not on file  . Highest education level: Not on file  Occupational History  . Not on file  Tobacco Use  . Smoking status: Never Smoker  . Smokeless tobacco: Never Used  Substance and Sexual Activity  . Alcohol use: Never  . Drug use: Never  . Sexual activity: Not on file  Other Topics Concern  . Not on file  Social History Narrative  . Not on file   Social Determinants of Health   Financial Resource Strain:   . Difficulty of Paying Living Expenses: Not on file  Food Insecurity:   . Worried About Charity fundraiser in the Last Year: Not on file  . Ran Out of Food in the Last Year: Not on file  Transportation Needs:   . Lack of Transportation (Medical): Not on file  . Lack of Transportation (Non-Medical): Not on file  Physical Activity:   . Days of Exercise per Week: Not on file  . Minutes of Exercise per Session: Not on file  Stress:   . Feeling of Stress : Not on file  Social Connections:   . Frequency of Communication with Friends and Family: Not on file  . Frequency of Social  Gatherings with Friends and Family: Not on file  . Attends Religious Services: Not on file  . Active Member of Clubs or Organizations: Not on file  . Attends Archivist Meetings: Not on file  . Marital Status: Not on file  Intimate Partner Violence:   . Fear of Current or Ex-Partner: Not on file  . Emotionally Abused: Not on file  . Physically Abused: Not on file  . Sexually Abused: Not on file    FAMILY HISTORY: Family History  Problem Relation Age of Onset  . Diabetes Mother   . Hypertension Mother   . CAD Brother        CABG at age 17    ALLERGIES:  is allergic to hctz [hydrochlorothiazide].  MEDICATIONS:  Current Outpatient Medications  Medication Sig Dispense Refill  . allopurinol (ZYLOPRIM) 100 MG tablet Take 100 mg by mouth 2 (two) times daily.   3  . amLODipine (NORVASC) 10 MG tablet  Take 1 tablet (10 mg total) by mouth daily. 90 tablet 3  . apixaban (ELIQUIS) 5 MG TABS tablet Take 5 mg by mouth 2 (two) times daily.    . calcium carbonate (TUMS - DOSED IN MG ELEMENTAL CALCIUM) 500 MG chewable tablet Chew 1 tablet by mouth at bedtime as needed for indigestion or heartburn.    . carvedilol (COREG) 12.5 MG tablet TAKE 1 TABLET BY MOUTH TWICE DAILY WITH A MEAL (Patient taking differently: Take 12.5 mg by mouth 2 (two) times daily with a meal. ) 180 tablet 3  . cloNIDine (CATAPRES) 0.3 MG tablet Take 0.3 mg by mouth daily.    . finasteride (PROSCAR) 5 MG tablet Take 5 mg by mouth daily.  2  . losartan (COZAAR) 100 MG tablet Take 100 mg by mouth daily.  5  . rosuvastatin (CRESTOR) 40 MG tablet Take 1 tablet (40 mg total) by mouth daily at 6 PM. 30 tablet 0   No current facility-administered medications for this visit.    REVIEW OF SYSTEMS:   A 10+ POINT REVIEW OF SYSTEMS WAS OBTAINED including neurology, dermatology, psychiatry, cardiac, respiratory, lymph, extremities, GI, GU, Musculoskeletal, constitutional, breasts, reproductive, HEENT.  All pertinent positives  are noted in the HPI.  All others are negative.   PHYSICAL EXAMINATION: ECOG PERFORMANCE STATUS: 1 - Symptomatic but completely ambulatory  . Vitals:   03/30/20 1135  BP: (!) 153/75  Pulse: 61  Resp: 18  Temp: 98.4 F (36.9 C)  SpO2: 99%   Filed Weights   03/30/20 1135  Weight: 259 lb 9.6 oz (117.8 kg)   .Body mass index is 38.34 kg/m.   GENERAL:alert, in no acute distress and comfortable SKIN: no acute rashes, no significant lesions EYES: conjunctiva are pink and non-injected, sclera anicteric OROPHARYNX: MMM, no exudates, no oropharyngeal erythema or ulceration NECK: supple, no JVD LYMPH:  no palpable lymphadenopathy in the cervical, axillary or inguinal regions LUNGS: clear to auscultation b/l with normal respiratory effort HEART: regular rate & rhythm ABDOMEN:  normoactive bowel sounds , non tender, not distended. No palpable hepatosplenomegaly.  Extremity: minimal pedal edema PSYCH: alert & oriented x 3 with fluent speech NEURO: no focal motor/sensory deficits  LABORATORY DATA:  I have reviewed the data as listed  . CBC Latest Ref Rng & Units 03/30/2020 12/16/2019 11/19/2019  WBC 4.0 - 10.5 K/uL 6.3 6.7 6.3  Hemoglobin 13.0 - 17.0 g/dL 13.8 14.4 14.3  Hematocrit 39 - 52 % 41.3 43.2 43.1  Platelets 150 - 400 K/uL 220 254 212    . CMP Latest Ref Rng & Units 03/30/2020 11/19/2019 09/05/2018  Glucose 70 - 99 mg/dL 120(H) 159(H) 114(H)  BUN 8 - 23 mg/dL 9 11 11   Creatinine 0.61 - 1.24 mg/dL 0.89 0.90 0.93  Sodium 135 - 145 mmol/L 138 137 138  Potassium 3.5 - 5.1 mmol/L 4.8 4.6 3.6  Chloride 98 - 111 mmol/L 103 104 103  CO2 22 - 32 mmol/L 29 25 25   Calcium 8.9 - 10.3 mg/dL 9.3 9.2 8.9  Total Protein 6.5 - 8.1 g/dL 7.0 7.3 -  Total Bilirubin 0.3 - 1.2 mg/dL 0.9 0.9 -  Alkaline Phos 38 - 126 U/L 48 48 -  AST 15 - 41 U/L 22 20 -  ALT 0 - 44 U/L 26 21 -   12/16/19 of Surgical Pathology [phone])  RADIOGRAPHIC STUDIES: I have personally reviewed the  radiological images as listed and agreed with the findings in the report. No results found. CTA  C/A/P 10/02/2019: IMPRESSION: 1. Stable aneurysmal dilatation of the ascending thoracic aorta. Recommend annual imaging followup by CTA or MRA. This recommendation follows 2010 ACCF/AHA/AATS/ACR/ASA/SCA/SCAI/SIR/STS/SVM Guidelines for the Diagnosis and Management of Patients with Thoracic Aortic Disease. Circulation. 2010; 121: I712-W580. Aortic aneurysm NOS (ICD10-I71.9) 2. Interval development of mediastinal and upper abdominal lymphadenopathy. Please correlate with any history of lymphoma or leukemia. Dedicated CT of the abdomen and pelvis may be useful to evaluate the extent of the abdominal adenopathy. 3. No evidence of pulmonary emboli on this exam. Resolution of the segmental lower lobe emboli seen previously.  These results will be called to the ordering clinician or representative by the Radiologist Assistant, and communication documented in the PACS or [employer].   Electronically Signed   By: Randa Ngo M.D.   On: 10/02/2019 17:00   ASSESSMENT & PLAN:   69 yo with   1) Generalized lymphadenopathy in the mediastinum and throughout abdomen and pelvis-  Bx ssuggesting low grade NHL -- likely Marginal zone lymphoma vs FL PLAN: -Discussed pt labwork today, 03/30/20; blood counts and chemistries are nml, LDH is WNL -No lab or clinical evidence of symptomatic Non-Hodgkin's Lymphoma progression at this time.  -Advised pt that for slow growing lymphomas we would not move to treat unless disease is causing: constitutional symptoms, cytopenias, bulky disease, or threatened end organs.  -Discussed symptoms for pt to be aware of including: fevers, chills, fatigue, night sweats, and weight loss.  -Advised pt that we would not want to treat if disease is not bothersome, as treatment could cause more symptoms.  -Recommend pt eat a healthy diet, get 20-30 minutes of exercise  each day, get adequate sleep, and de-stress.  Discussed CDC guidelines regarding the COVID19 booster. Pt received the J&J vaccine.  -Recommend graded sports compression socks to improve leg swelling.  -Recommend pt f/u with PCP for medication management  -Will get a CT C/A/P scan in 4 months  -Will see back in 18 weeks with labs    FOLLOW UP: CT chest/abd/pelvis in 16 weeks RTC with Dr Irene Limbo with labs in 18 weeks   The total time spent in the appt was 25 minutes and more than 50% was on counseling and direct patient cares.  All of the patient's questions were answered with apparent satisfaction. The patient knows to call the clinic with any problems, questions or concerns.   Sullivan Lone MD [place] AAHIVMS [facility] Hematology/Oncology Physician [facility]  (Office):       [phone] (Work cell):  [phone] (Fax):           [phone]  03/30/2020 12:33 PM  I, Yevette Edwards, am acting as a scribe for Dr. Sullivan Lone.   .I have reviewed the above documentation for accuracy and completeness, and I agree with the above. [name] MD

## 2020-04-21 NOTE — Progress Notes (Signed)
HPI: FU PAF and CAD.Pt admitted 2/20 with DOE; CTA showed bilateral subsegmental pulmonary emboli, dilated aortic root at 4.2 cm and coronary calcification.Troponin I 0.12. Echo showed normal LV function, G1DD, mild AI. Treated with apixaban. Also treated with statin for coronary calcification.Nuclear study July 2020 showed normal perfusion and ejection fraction 52%. Patient was noted to be in new onset atrial fibrillation. Monitor January 2021 showed sinus rhythm with PACs and PVCs. CTA March 2021 showed 4.1 x 4.2 cm thoracic aortic aneurysm; there was also note of development of mediastinal and upper abdominal lymphadenopathy.  Patient has subsequently been diagnosed with non-Hodgkin's lymphoma. Sincelast seen,patient denies dyspnea, chest pain, palpitations or syncope.  Current Outpatient Medications  Medication Sig Dispense Refill   allopurinol (ZYLOPRIM) 100 MG tablet Take 100 mg by mouth 2 (two) times daily.   3   amLODipine (NORVASC) 10 MG tablet Take 1 tablet (10 mg total) by mouth daily. (Patient taking differently: Take 5 mg by mouth daily. Patient takes half a tablet daily.) 90 tablet 3   apixaban (ELIQUIS) 5 MG TABS tablet Take 5 mg by mouth 2 (two) times daily.     calcium carbonate (TUMS - DOSED IN MG ELEMENTAL CALCIUM) 500 MG chewable tablet Chew 1 tablet by mouth at bedtime as needed for indigestion or heartburn.     carvedilol (COREG) 12.5 MG tablet TAKE 1 TABLET BY MOUTH TWICE DAILY WITH A MEAL (Patient taking differently: Take 12.5 mg by mouth 2 (two) times daily with a meal. ) 180 tablet 3   cloNIDine (CATAPRES) 0.3 MG tablet Take 0.3 mg by mouth daily.     finasteride (PROSCAR) 5 MG tablet Take 5 mg by mouth daily.  2   losartan (COZAAR) 100 MG tablet Take 100 mg by mouth daily.  5   rosuvastatin (CRESTOR) 40 MG tablet Take 1 tablet (40 mg total) by mouth daily at 6 PM. 30 tablet 0   No current facility-administered medications for this visit.     Past  Medical History:  Diagnosis Date   Ascending aortic aneurysm ([place]) 09/04/2018   Dyspnea 09/03/2018   Elevated troponin 09/03/2018   Gout    Hypercholesteremia    Hypertension    Nephrolithiasis    Palpitations 09/04/2018   PE (pulmonary thromboembolism) ([place]) 09/04/2018   Pulmonary embolism ([facility])     Past Surgical History:  Procedure Laterality Date   No prior surgery      Social History   Socioeconomic History   Marital status: Married    Spouse name: Not on file   Number of children: Not on file   Years of education: Not on file   Highest education level: Not on file  Occupational History   Not on file  Tobacco Use   Smoking status: Never Smoker   Smokeless tobacco: Never Used  Substance and Sexual Activity   Alcohol use: Never   Drug use: Never   Sexual activity: Not on file  Other Topics Concern   Not on file  Social History Narrative   Not on file   Social Determinants of Health   Financial Resource Strain:    Difficulty of Paying Living Expenses: Not on file  Food Insecurity:    Worried About Running Out of Food in the Last Year: Not on file   Ran Out of Food in the Last Year: Not on file  Transportation Needs:    Lack of Transportation (Medical): Not on file   Lack of Transportation (  Non-Medical): Not on file  Physical Activity:    Days of Exercise per Week: Not on file   Minutes of Exercise per Session: Not on file  Stress:    Feeling of Stress : Not on file  Social Connections:    Frequency of Communication with Friends and Family: Not on file   Frequency of Social Gatherings with Friends and Family: Not on file   Attends Religious Services: Not on file   Active Member of Clubs or Organizations: Not on file   Attends Archivist Meetings: Not on file   Marital Status: Not on file  Intimate Partner Violence:    Fear of Current or Ex-Partner: Not on file   Emotionally Abused: Not on file   Physically  Abused: Not on file   Sexually Abused: Not on file    Family History  Problem Relation Age of Onset   Diabetes Mother    Hypertension Mother    CAD Brother        CABG at age 13    ROS: no fevers or chills, productive cough, hemoptysis, dysphasia, odynophagia, melena, hematochezia, dysuria, hematuria, rash, seizure activity, orthopnea, PND, pedal edema, claudication. Remaining systems are negative.  Physical Exam: Well-developed well-nourished in no acute distress.  Skin is warm and dry.  HEENT is normal.  Neck is supple.  Chest is clear to auscultation with normal expansion.  Cardiovascular exam is regular rate and rhythm.  Abdominal exam nontender or distended. No masses palpated. Extremities show no edema. neuro grossly intact  ECG-sinus bradycardia at a rate of 57, no ST changes.  Personally reviewed  A/P  1 paroxysmal atrial fibrillation-patient remains in sinus rhythm.  Continue apixaban at present dose as well as beta-blocker.  2 thoracic aortic aneurysm-patient will need follow-up CTA March 2022.  3 hypertension-blood pressure elevated; add hydralazine 25 mg twice daily and increase as needed.  4 hyperlipidemia-continue statin.  5 palpitations-symptoms are controlled.  Continue beta-blocker at present dose.  6 coronary calcification-continue statin.  He is not on aspirin given need for apixaban.  7 previous pulmonary embolus-no precipitating factor previously and we will therefore continue apixaban (also on anticoagulation for history of paroxysmal atrial fibrillation).  8 recently diagnosed non-Hodgkin's lymphoma-followed by oncology.  Kirk Ruths, MD

## 2020-04-27 ENCOUNTER — Ambulatory Visit: Payer: Medicare Other | Admitting: Cardiology

## 2020-04-27 ENCOUNTER — Other Ambulatory Visit: Payer: Self-pay

## 2020-04-27 ENCOUNTER — Encounter: Payer: Self-pay | Admitting: Cardiology

## 2020-04-27 VITALS — BP 150/70 | HR 57 | Ht 69.0 in | Wt 259.0 lb

## 2020-04-27 DIAGNOSIS — I10 Essential (primary) hypertension: Secondary | ICD-10-CM

## 2020-04-27 DIAGNOSIS — I712 Thoracic aortic aneurysm, without rupture, unspecified: Secondary | ICD-10-CM

## 2020-04-27 DIAGNOSIS — I2510 Atherosclerotic heart disease of native coronary artery without angina pectoris: Secondary | ICD-10-CM | POA: Diagnosis not present

## 2020-04-27 DIAGNOSIS — I480 Paroxysmal atrial fibrillation: Secondary | ICD-10-CM

## 2020-04-27 MED ORDER — HYDRALAZINE HCL 25 MG PO TABS: 25.0000 mg | ORAL_TABLET | Freq: Two times a day (BID) | ORAL | 3 refills | Status: DC

## 2020-04-27 NOTE — Patient Instructions (Signed)
Medication Instructions:   START HYDRALAZINE 25 MG ONE TABLET TWICE DAILY  *If you need a refill on your cardiac medications before your next appointment, please call your pharmacy*  Follow-Up: At [facility], you and your health needs are our priority.  As part of our continuing mission to provide you with exceptional heart care, we have created designated Provider Care Teams.  These Care Teams include your primary Cardiologist (physician) and Advanced Practice Providers (APPs -  Physician Assistants and Nurse Practitioners) who all work together to provide you with the care you need, when you need it.  We recommend signing up for the patient portal called "MyChart".  Sign up information is provided on this After Visit Summary.  MyChart is used to connect with patients for Virtual Visits (Telemedicine).  Patients are able to view lab/test results, encounter notes, upcoming appointments, etc.  Non-urgent messages can be sent to your provider as well.   To learn more about what you can do with MyChart, go to [email or web address].    Your next appointment:   12 month(s)  The format for your next appointment:   In Person  Provider:   Kirk Ruths, MD

## 2020-06-10 ENCOUNTER — Telehealth: Payer: Self-pay | Admitting: Cardiology

## 2020-06-10 NOTE — Telephone Encounter (Signed)
° ° °  1. What dental office are you calling from? Dr. Particia Nearing   2. What is your office phone number? [phone]  3. What is your fax number? [phone]  4. What type of procedure is the patient having performed? 1 tooth extraction  5. What date is procedure scheduled or is the patient there now? TBD  (if the patient is at the dentist's office question goes to their cardiologist if he/she is in the office.  If not, question should go to the DOD).   6. What is your question (ex. Antibiotics prior to procedure, holding medication-we need to know how long dentist wants pt to hold med)? Dentist would like to know if pt needs to hold blood thinner

## 2020-06-10 NOTE — Telephone Encounter (Signed)
   Primary Cardiologist: Kirk Ruths, MD  Chart reviewed as part of pre-operative protocol coverage.   Simple dental extractions (2 or less) are considered low risk procedures per guidelines and generally do not require any specific cardiac clearance. It is also generally accepted that for simple extractions and dental cleanings, there is no need to interrupt blood thinner therapy.   SBE prophylaxis is not required for the patient from a cardiac standpoint.  I will route this recommendation to the requesting party via Epic fax function and remove from pre-op pool.  Please call with questions.  Tami Lin Skyeler Smola, PA 06/10/2020, 12:08 PM

## 2020-06-11 NOTE — Telephone Encounter (Signed)
Kenneth [name] is calling back stating Dr. Radford Pax is wanting to know if he can take the patient off his blood thinner to prevent excessive bleeding for the patient even though our office has advised it is not necessary. Please advise.

## 2020-06-11 NOTE — Telephone Encounter (Signed)
I will ask our PharmD to weigh in.

## 2020-06-11 NOTE — Telephone Encounter (Signed)
Patient with diagnosis of ATRIAL FIBRILLATION on ELIQUIS for anticoagulation.    Procedure: 1 TOOTH EXTRATION Date of procedure: TBD  NO need to hold anticoagulation for a single tooth extraction, 2 teeth, deep cleaning, filling, or root canal.

## 2020-06-12 NOTE — Telephone Encounter (Signed)
   Primary Cardiologist: Kirk Ruths, MD  Chart reviewed as part of pre-operative protocol coverage. Simple dental extractions are considered low risk procedures per guidelines and generally do not require any specific cardiac clearance. It is also generally accepted that for simple extractions and dental cleanings, there is no need to interrupt blood thinner therapy.   I have asked our clinical pharmacist to weigh in on the procedure.  They reviewed it and their recommendations are:  Patient with diagnosis of ATRIAL FIBRILLATION on ELIQUIS for anticoagulation.    Procedure: 1 TOOTH EXTRATION Date of procedure: TBD  NO need to hold anticoagulation for a single tooth extraction, 2 teeth, deep cleaning, filling, or root canal.   SBE prophylaxis is not required for the patient.  I will route this recommendation to the requesting party via Epic fax function and remove from pre-op pool.  Please call with questions.  Deberah Pelton, NP 06/12/2020, 7:51 AM

## 2020-07-20 ENCOUNTER — Other Ambulatory Visit: Payer: Self-pay

## 2020-07-20 ENCOUNTER — Inpatient Hospital Stay: Payer: Medicare Other | Attending: Hematology

## 2020-07-20 ENCOUNTER — Encounter (HOSPITAL_COMMUNITY): Payer: Self-pay

## 2020-07-20 ENCOUNTER — Ambulatory Visit (HOSPITAL_COMMUNITY)
Admission: RE | Admit: 2020-07-20 | Discharge: 2020-07-20 | Disposition: A | Discharge status: Home / Self Care | Payer: Medicare Other | Source: Ambulatory Visit | Attending: Hematology | Admitting: Hematology

## 2020-07-20 DIAGNOSIS — Z7901 Long term (current) use of anticoagulants: Secondary | ICD-10-CM | POA: Diagnosis not present

## 2020-07-20 DIAGNOSIS — Z8572 Personal history of non-Hodgkin lymphomas: Secondary | ICD-10-CM | POA: Diagnosis not present

## 2020-07-20 DIAGNOSIS — C8580 Other specified types of non-Hodgkin lymphoma, unspecified site: Secondary | ICD-10-CM | POA: Insufficient documentation

## 2020-07-20 DIAGNOSIS — Z86711 Personal history of pulmonary embolism: Secondary | ICD-10-CM | POA: Diagnosis not present

## 2020-07-20 DIAGNOSIS — Z8679 Personal history of other diseases of the circulatory system: Secondary | ICD-10-CM | POA: Diagnosis not present

## 2020-07-20 DIAGNOSIS — I10 Essential (primary) hypertension: Secondary | ICD-10-CM | POA: Diagnosis not present

## 2020-07-20 LAB — CBC WITH DIFFERENTIAL/PLATELET
Abs Immature Granulocytes: 0.01 10*3/uL (ref 0.00–0.07)
Basophils Absolute: 0.1 10*3/uL (ref 0.0–0.1)
Basophils Relative: 1 %
Eosinophils Absolute: 0.2 10*3/uL (ref 0.0–0.5)
Eosinophils Relative: 4 %
HCT: 43.4 % (ref 39.0–52.0)
Hemoglobin: 14.2 g/dL (ref 13.0–17.0)
Immature Granulocytes: 0 %
Lymphocytes Relative: 26 %
Lymphs Abs: 1.4 10*3/uL (ref 0.7–4.0)
MCH: 28.1 pg (ref 26.0–34.0)
MCHC: 32.7 g/dL (ref 30.0–36.0)
MCV: 85.8 fL (ref 80.0–100.0)
Monocytes Absolute: 0.6 10*3/uL (ref 0.1–1.0)
Monocytes Relative: 11 %
Neutro Abs: 3.1 10*3/uL (ref 1.7–7.7)
Neutrophils Relative %: 58 %
Platelets: 262 10*3/uL (ref 150–400)
RBC: 5.06 MIL/uL (ref 4.22–5.81)
RDW: 13 % (ref 11.5–15.5)
WBC: 5.4 10*3/uL (ref 4.0–10.5)
nRBC: 0 % (ref 0.0–0.2)

## 2020-07-20 LAB — CMP (CANCER CENTER ONLY)
ALT: 18 U/L (ref 0–44)
AST: 19 U/L (ref 15–41)
Albumin: 4.4 g/dL (ref 3.5–5.0)
Alkaline Phosphatase: 48 U/L (ref 38–126)
Anion gap: 8 (ref 5–15)
BUN: 8 mg/dL (ref 8–23)
CO2: 26 mmol/L (ref 22–32)
Calcium: 9.6 mg/dL (ref 8.9–10.3)
Chloride: 105 mmol/L (ref 98–111)
Creatinine: 0.83 mg/dL (ref 0.61–1.24)
GFR, Estimated: >60 mL/min (ref >60)
Glucose, Bld: 104 mg/dL — ABNORMAL HIGH (ref 70–99)
Potassium: 4.2 mmol/L (ref 3.5–5.1)
Sodium: 139 mmol/L (ref 135–145)
Total Bilirubin: 0.9 mg/dL (ref 0.3–1.2)
Total Protein: 7.7 g/dL (ref 6.5–8.1)

## 2020-07-20 LAB — LACTATE DEHYDROGENASE: LDH: 170 U/L (ref 98–192)

## 2020-07-20 MED ORDER — IOHEXOL 300 MG/ML  SOLN
100.0000 mL | Freq: Once | INTRAMUSCULAR | Status: AC | PRN
  Administered 2020-07-20: 100 mL via INTRAVENOUS

## 2020-07-21 ENCOUNTER — Telehealth: Payer: Self-pay | Admitting: Hematology

## 2020-07-21 NOTE — Telephone Encounter (Signed)
Called patient regarding upcoming appointment, left a voicemail. 

## 2020-07-31 ENCOUNTER — Other Ambulatory Visit: Payer: Self-pay

## 2020-07-31 DIAGNOSIS — C8580 Other specified types of non-Hodgkin lymphoma, unspecified site: Secondary | ICD-10-CM

## 2020-08-02 NOTE — Progress Notes (Signed)
HEMATOLOGY/ONCOLOGY CLINIC NOTE  Date of Service: 08/02/2020  Patient Care Team: Ralene Ok, MD as PCP - General (Internal Medicine) Jens Som Madolyn Frieze, MD as PCP - Cardiology (Cardiology)  CHIEF COMPLAINTS/PURPOSE OF CONSULTATION:  F/u for NHL  HISTORY OF PRESENTING ILLNESS:  Kenneth [name] is a wonderful 70 y.o. male who has been referred to [place] by Dr Dewaine Conger for evaluation and management of lymphadenopathy. The pt reports that he is doing well overall.   The pt reports that he began to have some back discomfort the day after his CT scan, but has otherwise felt well over the last 6 months. When he stands up, lays down, or walks he does not experience the pain. When he sits for a long period of time or slouches it begins to hurt. He denies any recent changes in his urine or urinary habits. He has previously had kidney stones. Pt also reports overall fatigue, but thinks that this is best explained by his advanced age. He has not had any recent infections. Pt has a cat that he sometimes sleeps in close quarters with. He received his COVID19 vaccine after his scan on 03/24, but prior to his scan on 04/29.  Pt does have some lower leg swelling in both legs which he attributes to weight gain. Pt has had leg swelling in the past during a Gout attack. Pt had PE in 02/20, for which no cause has been found, but there was concern that a blood clot from his lower extremities traveled to his lungs. He has continued using anti-coagulation therapy as prescribed. Pt had a localized Melanoma on his right shoulder that was removed 20 years ago. He was seen at [facility] and received a short series of injections. He last saw his Dermatologist three years ago and there were no worries of recurrence. Pt does have occasional lightheadedness, about once per week, that is improved after eating. About 3-4 years ago he was having episodes where he felt that he had to focus on breathing because he was not breathing  automatically. Pt has had an epididymal cyst for several years that has been unchanging.   Of note prior to the patient's visit today, pt has had CT Abd/Pel ([id number]) completed on 11/07/2019 with results revealing "Mild lymphadenopathy throughout the abdomen and pelvis, new since 2011 exam. Suggest correlation for clinical/laboratory evidence of lymphoproliferative disorder, and consider short-term follow-up by CT in 3 months. Stable small benign left adrenal adenoma. Left nephrolithiasis. No evidence of ureteral calculi or hydronephrosis."   Most recent lab results (10/18/2019) of CBC w/diff and CMP is as follows: all values are WNL except for RDW at 40.3, Glucose at 117.  On review of systems, pt reports lower leg swelling, lightheadedness, back pain, fatigue and denies dysuria, hematuria, discolored urine, polyuria, fevers, chills, night sweats, unexpected weight loss, loss of appetite, diarrhea, constipation, abdominal pain, testicular pain/swelling and any other symptoms.   On PMHx the pt reports PE, Palpitations, HTN, Hypercholesterolemia, Ascending Aortic Aneurysm, Gout, Melanoma. On Social Hx the pt reports that he does not smoke cigarettes and does not drink alcohol.    INTERVAL HISTORY: Kenneth [name] is a wonderful 70 y.o. male who is here for evaluation and management of low-grade Non-Hodgkin's Lymphoma. The patient's last visit with [place] was on 03/30/2020. The pt reports that he is doing well overall.  The pt reports no new symptoms or concerns. He notes he experienced a racing heart, which was stopped by discontinuing Amlodipine by himself and  this improved the symptoms. The pt notes that he did f/u with this with his PCP but has not f/u with his cardiologist.   The pt denies being on Prednisone or steroids recently.   The pt notes that he has received his COVID vaccines and booster, annual flu vaccine; denies Pneumovax and Shingrix vaccine.  Of note since the patient's last  visit, pt has had CT Chest/Abd/Pel ([id number]) on 07/20/2020, which revealed "1. Interval decrease in size of prominent, previously FDG avid lymph nodes throughout the chest, abdomen, and pelvis, consistent with treatment response of lymphoma. 2. Background of innumerable very fine centrilobular pulmonary nodules, most consistent with smoking-related respiratory bronchiolitis. 3. Coronary artery disease."  Lab results today 08/03/2020 of CBC w/diff and CMP is as follows: all values are WNL except for Glucose at 107. 08/03/2020 LDH at 142.  On review of systems, pt denies fevers, chills, night sweats, unexpected weight loss, decreased appetite, new lumps/bumps, fatigue, abdominal pain, changes in bowel habits and any other symptoms.    MEDICAL HISTORY:  Past Medical History:  Diagnosis Date  . Ascending aortic aneurysm ([place]) 09/04/2018  . Dyspnea 09/03/2018  . Elevated troponin 09/03/2018  . Gout   . Hypercholesteremia   . Hypertension   . Nephrolithiasis   . Palpitations 09/04/2018  . PE (pulmonary thromboembolism) ([place]) 09/04/2018  . Pulmonary embolism ([place])     SURGICAL HISTORY: Past Surgical History:  Procedure Laterality Date  . No prior surgery      SOCIAL HISTORY: Social History   Socioeconomic History  . Marital status: Married    Spouse name: Not on file  . Number of children: Not on file  . Years of education: Not on file  . Highest education level: Not on file  Occupational History  . Not on file  Tobacco Use  . Smoking status: Never Smoker  . Smokeless tobacco: Never Used  Substance and Sexual Activity  . Alcohol use: Never  . Drug use: Never  . Sexual activity: Not on file  Other Topics Concern  . Not on file  Social History Narrative  . Not on file   Social Determinants of Health   Financial Resource Strain: Not on file  Food Insecurity: Not on file  Transportation Needs: Not on file  Physical Activity: Not on file  Stress: Not on file  Social  Connections: Not on file  Intimate Partner Violence: Not on file    FAMILY HISTORY: Family History  Problem Relation Age of Onset  . Diabetes Mother   . Hypertension Mother   . CAD Brother        CABG at age 56    ALLERGIES:  is allergic to hctz [hydrochlorothiazide].  MEDICATIONS:  Current Outpatient Medications  Medication Sig Dispense Refill  . allopurinol (ZYLOPRIM) 100 MG tablet Take 100 mg by mouth 2 (two) times daily.   3  . amLODipine (NORVASC) 10 MG tablet Take 1 tablet (10 mg total) by mouth daily. (Patient taking differently: Take 5 mg by mouth daily. Patient takes half a tablet daily.) 90 tablet 3  . apixaban (ELIQUIS) 5 MG TABS tablet Take 5 mg by mouth 2 (two) times daily.    . calcium carbonate (TUMS - DOSED IN MG ELEMENTAL CALCIUM) 500 MG chewable tablet Chew 1 tablet by mouth at bedtime as needed for indigestion or heartburn.    . carvedilol (COREG) 12.5 MG tablet TAKE 1 TABLET BY MOUTH TWICE DAILY WITH A MEAL (Patient taking differently: Take 12.5 mg by  mouth 2 (two) times daily with a meal. ) 180 tablet 3  . cloNIDine (CATAPRES) 0.3 MG tablet Take 0.3 mg by mouth daily.    . finasteride (PROSCAR) 5 MG tablet Take 5 mg by mouth daily.  2  . hydrALAZINE (APRESOLINE) 25 MG tablet Take 1 tablet (25 mg total) by mouth 2 (two) times daily. 180 tablet 3  . losartan (COZAAR) 100 MG tablet Take 100 mg by mouth daily.  5  . rosuvastatin (CRESTOR) 40 MG tablet Take 1 tablet (40 mg total) by mouth daily at 6 PM. 30 tablet 0   No current facility-administered medications for this visit.    REVIEW OF SYSTEMS:   10 Point review of Systems was done is negative except as noted above.   PHYSICAL EXAMINATION: ECOG PERFORMANCE STATUS: 1 - Symptomatic but completely ambulatory  . Vitals:   08/03/20 1202  BP: (!) 161/75  Pulse: 60  Resp: 18  Temp: 97.8 F (36.6 C)  SpO2: 98%   Filed Weights   08/03/20 1202  Weight: 260 lb (117.9 kg)   .Body mass index is 38.4 kg/m.     GENERAL:alert, in no acute distress and comfortable SKIN: no acute rashes, no significant lesions EYES: conjunctiva are pink and non-injected, sclera anicteric OROPHARYNX: MMM, no exudates, no oropharyngeal erythema or ulceration NECK: supple, no JVD LYMPH:  no palpable lymphadenopathy in the cervical, axillary or inguinal regions LUNGS: clear to auscultation b/l with normal respiratory effort HEART: regular rate & rhythm ABDOMEN:  normoactive bowel sounds , non tender, not distended. Extremity: no pedal edema PSYCH: alert & oriented x 3 with fluent speech NEURO: no focal motor/sensory deficits   LABORATORY DATA:  I have reviewed the data as listed  . CBC Latest Ref Rng & Units 08/03/2020 07/20/2020 03/30/2020  WBC 4.0 - 10.5 K/uL 5.7 5.4 6.3  Hemoglobin 13.0 - 17.0 g/dL 13.6 14.2 13.8  Hematocrit 39.0 - 52.0 % 41.4 43.4 41.3  Platelets 150 - 400 K/uL 215 262 220    . CMP Latest Ref Rng & Units 08/03/2020 07/20/2020 03/30/2020  Glucose 70 - 99 mg/dL 107(H) 104(H) 120(H)  BUN 8 - 23 mg/dL 9 8 9   Creatinine 0.61 - 1.24 mg/dL 0.93 0.83 0.89  Sodium 135 - 145 mmol/L 140 139 138  Potassium 3.5 - 5.1 mmol/L 4.2 4.2 4.8  Chloride 98 - 111 mmol/L 105 105 103  CO2 22 - 32 mmol/L 27 26 29   Calcium 8.9 - 10.3 mg/dL 9.0 9.6 9.3  Total Protein 6.5 - 8.1 g/dL 7.1 7.7 7.0  Total Bilirubin 0.3 - 1.2 mg/dL 0.9 0.9 0.9  Alkaline Phos 38 - 126 U/L 42 48 48  AST 15 - 41 U/L 18 19 22   ALT 0 - 44 U/L 18 18 26    . Lab Results  Component Value Date   LDH 142 08/03/2020    12/16/19 of Surgical Pathology [id number])  RADIOGRAPHIC STUDIES: I have personally reviewed the radiological images as listed and agreed with the findings in the report. CT CHEST ABDOMEN PELVIS W CONTRAST  Result Date: 07/20/2020 CLINICAL DATA:  Low-grade NHL/marginal zone lymphoma, evaluate for progression EXAM: CT CHEST, ABDOMEN, AND PELVIS WITH CONTRAST TECHNIQUE: Multidetector CT imaging of the chest, abdomen  and pelvis was performed following the standard protocol during bolus administration of intravenous contrast. 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 Left and right coronary artery calcifications. Normal heart size. No pericardial effusion. Mediastinum/Nodes: Interval decrease in size of prominent, previously FDG avid AP window lymph nodes, largest node measuring 1.3 x 0.9 cm, previously 2.3 x 1.0 cm (series 2, image 25). Thyroid gland, trachea, and esophagus demonstrate no significant findings. Lungs/Pleura: Background of innumerable very fine centrilobular pulmonary nodules, most concentrated in the lung apices. No pleural effusion or pneumothorax. Musculoskeletal: No chest wall mass or suspicious bone lesions identified. CT ABDOMEN PELVIS FINDINGS Hepatobiliary: No solid liver abnormality is seen. No gallstones, gallbladder wall thickening, or biliary dilatation. Pancreas: Unremarkable. No pancreatic ductal dilatation or surrounding inflammatory changes. Spleen: Normal in size without significant abnormality. Adrenals/Urinary Tract: Stable, benign small left adrenal adenoma (series 2, image 65). Multiple small left-sided renal calculi. No right-sided calculi, ureteral calculi, or hydronephrosis. Bladder is unremarkable. Stomach/Bowel: Stomach is within normal limits. Appendix appears normal. No evidence of bowel wall thickening, distention, or inflammatory changes. Vascular/Lymphatic: Aortic atherosclerosis. Essentially complete interval resolution of lymphadenopathy noted throughout the abdomen and pelvis. An index node of the central small bowel mesentery measures 0.9 x 0.7 cm, previously 1.3 x 1.2 cm (series 2, image 70). An index right iliac lymph node measures 0.7 x 0.5 cm, previously 1.8 x  1.6 cm when measured similarly (series 2, image 95). Reproductive: No mass or other abnormality. Other: No abdominal wall hernia or abnormality. No abdominopelvic ascites. Musculoskeletal: No acute or significant osseous findings. IMPRESSION: 1. Interval decrease in size of prominent, previously FDG avid lymph nodes throughout the chest, abdomen, and pelvis, consistent with treatment response of lymphoma. 2. Background of innumerable very fine centrilobular pulmonary nodules, most consistent with smoking-related respiratory bronchiolitis. 3. Coronary artery disease. Aortic Atherosclerosis (ICD10-I70.0). Electronically Signed   By: Eddie Candle M.D.   On: 07/20/2020 16:46   CTA C/A/P 10/02/2019: IMPRESSION: 1. Stable aneurysmal dilatation of the ascending thoracic aorta. Recommend annual imaging followup by CTA or MRA. This recommendation follows 2010 ACCF/AHA/AATS/ACR/ASA/SCA/SCAI/SIR/STS/SVM Guidelines for the Diagnosis and Management of Patients with Thoracic Aortic Disease. Circulation. 2010; [id number]. Aortic aneurysm NOS (ICD10-I71.9) 2. Interval development of mediastinal and upper abdominal lymphadenopathy. Please correlate with any history of lymphoma or leukemia. Dedicated CT of the abdomen and pelvis may be useful to evaluate the extent of the abdominal adenopathy. 3. No evidence of pulmonary emboli on this exam. Resolution of the segmental lower lobe emboli seen previously.  These results will be called to the ordering clinician or representative by the Radiologist Assistant, and communication documented in the PACS or [employer].   Electronically Signed   By: Randa Ngo M.D.   On: 10/02/2019 17:00   ASSESSMENT & PLAN:   70 yo with   1) Low grade Non Hodgkins lymphoma Presented with generalized lymphadenopathy in the mediastinum and throughout abdomen and pelvis-  Bx ssuggesting low grade NHL -- likely Marginal zone lymphoma vs FL  PLAN: -Discussed pt  labwork today, 08/03/2020; blood counts all normal and chemistries normal, LDH normal.  -Discussed CT Chest/Abd/Pel ([id number]) on 07/20/2020; shrinkage in lymph node, no signs of progression. -Continue to monitor the Lymphoma.  -recommend healthy diet, de-stressing, increasing water intake and physical activity. -Recommend pt take Vitamin D 2000 IU daily. -Recommend pt f/u with Dr. Stanford Breed regarding Afib. -Continue Eliquis and Carvedilol. -No lab or clinical evidence of symptomatic Non-Hodgkin's Lymphoma progression at this time.  -Recommend pt f/u with PCP regarding Pneumovax and Shingrix vaccines.  -Will see back in 6 months with labs. -  Will get CT Chest/Abd/Pel in 1 year.    FOLLOW UP: RTC with Dr Irene Limbo with labs in 6 months  The total time spent in the appointment was 30 minutes and more than 50% was on counseling and direct patient cares.  All of the patient's questions were answered with apparent satisfaction. The patient knows to call the clinic with any problems, questions or concerns.   Sullivan Lone MD [place] AAHIVMS [facility] Hematology/Oncology Physician [facility]  (Office):       [phone] (Work cell):  [phone] (Fax):           [phone]  08/02/2020 9:27 AM   I, Reinaldo Raddle, am acting as scribe for Dr. Sullivan Lone, MD.     .I have reviewed the above documentation for accuracy and completeness, and I agree with the above. [name] MD

## 2020-08-03 ENCOUNTER — Inpatient Hospital Stay: Payer: Medicare Other

## 2020-08-03 ENCOUNTER — Other Ambulatory Visit: Payer: Self-pay

## 2020-08-03 ENCOUNTER — Inpatient Hospital Stay: Payer: Medicare Other | Admitting: Hematology

## 2020-08-03 VITALS — BP 161/75 | HR 60 | Temp 97.8°F | Resp 18 | Ht 69.0 in | Wt 260.0 lb

## 2020-08-03 DIAGNOSIS — C8580 Other specified types of non-Hodgkin lymphoma, unspecified site: Secondary | ICD-10-CM

## 2020-08-03 DIAGNOSIS — Z8572 Personal history of non-Hodgkin lymphomas: Secondary | ICD-10-CM | POA: Diagnosis not present

## 2020-08-03 LAB — CMP (CANCER CENTER ONLY)
ALT: 18 U/L (ref 0–44)
AST: 18 U/L (ref 15–41)
Albumin: 4.2 g/dL (ref 3.5–5.0)
Alkaline Phosphatase: 42 U/L (ref 38–126)
Anion gap: 8 (ref 5–15)
BUN: 9 mg/dL (ref 8–23)
CO2: 27 mmol/L (ref 22–32)
Calcium: 9 mg/dL (ref 8.9–10.3)
Chloride: 105 mmol/L (ref 98–111)
Creatinine: 0.93 mg/dL (ref 0.61–1.24)
GFR, Estimated: >60 mL/min (ref >60)
Glucose, Bld: 107 mg/dL — ABNORMAL HIGH (ref 70–99)
Potassium: 4.2 mmol/L (ref 3.5–5.1)
Sodium: 140 mmol/L (ref 135–145)
Total Bilirubin: 0.9 mg/dL (ref 0.3–1.2)
Total Protein: 7.1 g/dL (ref 6.5–8.1)

## 2020-08-03 LAB — CBC WITH DIFFERENTIAL (CANCER CENTER ONLY)
Abs Immature Granulocytes: 0.02 10*3/uL (ref 0.00–0.07)
Basophils Absolute: 0 10*3/uL (ref 0.0–0.1)
Basophils Relative: 1 %
Eosinophils Absolute: 0.2 10*3/uL (ref 0.0–0.5)
Eosinophils Relative: 3 %
HCT: 41.4 % (ref 39.0–52.0)
Hemoglobin: 13.6 g/dL (ref 13.0–17.0)
Immature Granulocytes: 0 %
Lymphocytes Relative: 26 %
Lymphs Abs: 1.5 10*3/uL (ref 0.7–4.0)
MCH: 28 pg (ref 26.0–34.0)
MCHC: 32.9 g/dL (ref 30.0–36.0)
MCV: 85.4 fL (ref 80.0–100.0)
Monocytes Absolute: 0.7 10*3/uL (ref 0.1–1.0)
Monocytes Relative: 13 %
Neutro Abs: 3.3 10*3/uL (ref 1.7–7.7)
Neutrophils Relative %: 57 %
Platelet Count: 215 10*3/uL (ref 150–400)
RBC: 4.85 MIL/uL (ref 4.22–5.81)
RDW: 13 % (ref 11.5–15.5)
WBC Count: 5.7 10*3/uL (ref 4.0–10.5)
nRBC: 0 % (ref 0.0–0.2)

## 2020-08-03 LAB — LACTATE DEHYDROGENASE: LDH: 142 U/L (ref 98–192)

## 2020-09-14 ENCOUNTER — Telehealth: Payer: Self-pay | Admitting: *Deleted

## 2020-09-14 NOTE — Telephone Encounter (Signed)
Spoke with pt, he had a CTA scan in 07-20-2020 but there is no mention of the size of the thoracic aneurysm. Spoke with [place] radiology and they will have the scan looked at and size the aorta for [place].

## 2020-09-18 NOTE — Telephone Encounter (Signed)
ADDENDUM REPORT: 09/14/2020 15:38  ADDENDUM: The tubular ascending thoracic aorta measures 4.0 x 4.0 cm on this examination which is not a tailored angiogram. This is not significantly changed compared to prior examinations. Most remote prior examination of the chest is dated 09/04/2018.   Electronically Signed   By: Eddie Candle M.D.   On: 09/14/2020 15:38   Called patient back about his message. Informed patient about the Addendum report of his CT. Will send message to Dr. Stanford Breed and his nurse to get in touch with patient after he reviews test for his input.

## 2020-09-18 NOTE — Telephone Encounter (Signed)
Patient is following up. He would like to know if we have an update from [facility]. Please advise.

## 2020-09-21 NOTE — Telephone Encounter (Signed)
Spoke with pt, aware of ct results. Follow up scheduled at patient request for recurrent palpitations.

## 2020-09-21 NOTE — Telephone Encounter (Signed)
Minimally dilated thoracic aorta; fu study 2 years from most recent Kirk Ruths

## 2020-10-09 ENCOUNTER — Other Ambulatory Visit: Payer: Self-pay | Admitting: Cardiology

## 2020-10-14 ENCOUNTER — Ambulatory Visit (INDEPENDENT_AMBULATORY_CARE_PROVIDER_SITE_OTHER): Payer: Medicare Other | Admitting: Otolaryngology

## 2020-10-19 NOTE — Progress Notes (Signed)
HPI: FU PAF and CAD.Pt admitted 2/20 with DOE; CTA showed bilateral subsegmental pulmonary emboli, dilated aortic root at 4.2 cm and coronary calcification.Troponin I 0.12. Echo showed normal LV function, G1DD, mild AI. Treated with apixaban. Also treated with statin for coronary calcification.Nuclear study July 2020 showed normal perfusion and ejection fraction 52%. Patient was noted to be in new onset atrial fibrillation. Monitor January 2021 showed sinus rhythm with PACs and PVCs. CT January 2022 showed 4 x 4 centimeter ascending thoracic aortic aneurysm.  There was decrease in size of lymph nodes consistent with treatment of lymphoma.  Sincelast seen,the patient has dyspnea with more extreme activities but not with routine activities. It is relieved with rest. It is not associated with chest pain. There is no orthopnea, PND or pedal edema. There is no syncope or palpitations. There is no exertional chest pain.   Current Outpatient Medications  Medication Sig Dispense Refill  . allopurinol (ZYLOPRIM) 100 MG tablet Take 100 mg by mouth 2 (two) times daily.   3  . apixaban (ELIQUIS) 5 MG TABS tablet Take 5 mg by mouth 2 (two) times daily.    . calcium carbonate (TUMS - DOSED IN MG ELEMENTAL CALCIUM) 500 MG chewable tablet Chew 1 tablet by mouth at bedtime as needed for indigestion or heartburn.    . carvedilol (COREG) 12.5 MG tablet TAKE 1 TABLET BY MOUTH TWICE DAILY WITH A MEAL 180 tablet 0  . cloNIDine (CATAPRES) 0.3 MG tablet Take 0.3 mg by mouth daily.    . finasteride (PROSCAR) 5 MG tablet Take 5 mg by mouth daily.  2  . losartan (COZAAR) 100 MG tablet Take 100 mg by mouth daily.  5  . rosuvastatin (CRESTOR) 40 MG tablet Take 1 tablet (40 mg total) by mouth daily at 6 PM. 30 tablet 0  . hydrALAZINE (APRESOLINE) 25 MG tablet Take 1 tablet (25 mg total) by mouth 2 (two) times daily. 180 tablet 3   No current facility-administered medications for this visit.     Past Medical  History:  Diagnosis Date  . Ascending aortic aneurysm ([place]) 09/04/2018  . Dyspnea 09/03/2018  . Elevated troponin 09/03/2018  . Gout   . Hypercholesteremia   . Hypertension   . Nephrolithiasis   . Palpitations 09/04/2018  . PE (pulmonary thromboembolism) ([place]) 09/04/2018  . Pulmonary embolism [facility])     Past Surgical History:  Procedure Laterality Date  . No prior surgery      Social History   Socioeconomic History  . Marital status: Married    Spouse name: Not on file  . Number of children: Not on file  . Years of education: Not on file  . Highest education level: Not on file  Occupational History  . Not on file  Tobacco Use  . Smoking status: Never Smoker  . Smokeless tobacco: Never Used  Substance and Sexual Activity  . Alcohol use: Never  . Drug use: Never  . Sexual activity: Not on file  Other Topics Concern  . Not on file  Social History Narrative  . Not on file   Social Determinants of Health   Financial Resource Strain: Not on file  Food Insecurity: Not on file  Transportation Needs: Not on file  Physical Activity: Not on file  Stress: Not on file  Social Connections: Not on file  Intimate Partner Violence: Not on file    Family History  Problem Relation Age of Onset  . Diabetes Mother   .  Hypertension Mother   . CAD Brother        CABG at age 30    ROS: Knee arthralgias but no fevers or chills, productive cough, hemoptysis, dysphasia, odynophagia, melena, hematochezia, dysuria, hematuria, rash, seizure activity, orthopnea, PND, pedal edema, claudication. Remaining systems are negative.  Physical Exam: Well-developed well-nourished in no acute distress.  Skin is warm and dry.  HEENT is normal.  Neck is supple.  Chest is clear to auscultation with normal expansion.  Cardiovascular exam is regular rate and rhythm.  Abdominal exam nontender or distended. No masses palpated. Extremities show no edema. neuro grossly intact  ECG-normal sinus rhythm  at a rate of 62, occasional PAC.  Personally reviewed  A/P  1 paroxysmal atrial fibrillation-patient remains in sinus rhythm.  Continue apixaban and beta-blocker at present dose.  2 thoracic aortic aneurysm-plan follow-up CTA Jan 2023.  3 hypertension-blood pressure elevated; increase hydralazine to 50 mg twice daily and follow.  4 hyperlipidemia-continue statin.  5 coronary calcification-continue statin.  He is not on aspirin given need for apixaban.  6 palpitations-continue beta-blocker.  7 prior pulmonary embolus-he is on apixaban for atrial fibrillation which will be continued.  8 non-Hodgkin's lymphoma-Per oncology.  Kirk Ruths, MD

## 2020-10-27 ENCOUNTER — Ambulatory Visit: Payer: Medicare Other | Admitting: Cardiology

## 2020-10-27 ENCOUNTER — Other Ambulatory Visit: Payer: Self-pay

## 2020-10-27 ENCOUNTER — Encounter: Payer: Self-pay | Admitting: Cardiology

## 2020-10-27 VITALS — BP 142/86 | HR 62 | Ht 68.5 in | Wt 267.0 lb

## 2020-10-27 DIAGNOSIS — I712 Thoracic aortic aneurysm, without rupture, unspecified: Secondary | ICD-10-CM

## 2020-10-27 DIAGNOSIS — I480 Paroxysmal atrial fibrillation: Secondary | ICD-10-CM | POA: Diagnosis not present

## 2020-10-27 DIAGNOSIS — I10 Essential (primary) hypertension: Secondary | ICD-10-CM | POA: Diagnosis not present

## 2020-10-27 DIAGNOSIS — I2510 Atherosclerotic heart disease of native coronary artery without angina pectoris: Secondary | ICD-10-CM | POA: Diagnosis not present

## 2020-10-27 MED ORDER — HYDRALAZINE HCL 50 MG PO TABS: 50.0000 mg | ORAL_TABLET | Freq: Two times a day (BID) | ORAL | 3 refills | Status: AC

## 2020-10-27 NOTE — Patient Instructions (Signed)
Medication Instructions:  Increase hydralazine to 50mg  twice a day.   *If you need a refill on your cardiac medications before your next appointment, please call your pharmacy*   Lab Work: None ordered If you have labs (blood work) drawn today and your tests are completely normal, you will receive your results only by: Marland Kitchen MyChart Message (if you have MyChart) OR . A paper copy in the mail If you have any lab test that is abnormal or we need to change your treatment, we will call you to review the results.   Testing/Procedures: None ordered  Follow-Up: At [facility], you and your health needs are our priority.  As part of our continuing mission to provide you with exceptional heart care, we have created designated Provider Care Teams.  These Care Teams include your primary Cardiologist (physician) and Advanced Practice Providers (APPs -  Physician Assistants and Nurse Practitioners) who all work together to provide you with the care you need, when you need it.  We recommend signing up for the patient portal called "MyChart".  Sign up information is provided on this After Visit Summary.  MyChart is used to connect with patients for Virtual Visits (Telemedicine).  Patients are able to view lab/test results, encounter notes, upcoming appointments, etc.  Non-urgent messages can be sent to your provider as well.   To learn more about what you can do with MyChart, go to [email or web address].    Your next appointment:   6 month(s)  The format for your next appointment:   In Person  Provider:   Kirk Ruths, MD

## 2021-01-18 ENCOUNTER — Other Ambulatory Visit: Payer: Self-pay | Admitting: Cardiology

## 2021-01-30 NOTE — Progress Notes (Signed)
HEMATOLOGY/ONCOLOGY CLINIC NOTE  Date of Service: 01/30/2021  Patient Care Team: [name], MD as PCP - General (Internal Medicine) Stanford Breed Denice Bors, MD as PCP - Cardiology (Cardiology)  CHIEF COMPLAINTS/PURPOSE OF CONSULTATION:  F/u for NHL  HISTORY OF PRESENTING ILLNESS:  Kenneth [name] is a wonderful 70 y.o. male who has been referred to [place] by Dr Wyatt Portela for evaluation and management of lymphadenopathy. The pt reports that he is doing well overall.    The pt reports that he began to have some back discomfort the day after his CT scan, but has otherwise felt well over the last 6 months. When he stands up, lays down, or walks he does not experience the pain. When he sits for a long period of time or slouches it begins to hurt. He denies any recent changes in his urine or urinary habits. He has previously had kidney stones. Pt also reports overall fatigue, but thinks that this is best explained by his advanced age. He has not had any recent infections. Pt has a cat that he sometimes sleeps in close quarters with. He received his COVID19 vaccine after his scan on 03/24, but prior to his scan on 04/29.   Pt does have some lower leg swelling in both legs which he attributes to weight gain. Pt has had leg swelling in the past during a Gout attack. Pt had PE in 02/20, for which no cause has been found, but there was concern that a blood clot from his lower extremities traveled to his lungs. He has continued using anti-coagulation therapy as prescribed. Pt had a localized Melanoma on his right shoulder that was removed 20 years ago. He was seen at [facility] and received a short series of injections. He last saw his Dermatologist three years ago and there were no worries of recurrence. Pt does have occasional lightheadedness, about once per week, that is improved after eating. About 3-4 years ago he was having episodes where he felt that he had to focus on breathing because he was not breathing  automatically. Pt has had an epididymal cyst for several years that has been unchanging.    Of note prior to the patient's visit today, pt has had CT Abd/Pel ([id number]) completed on 11/07/2019 with results revealing "Mild lymphadenopathy throughout the abdomen and pelvis, new since 2011 exam. Suggest correlation for clinical/laboratory evidence of lymphoproliferative disorder, and consider short-term follow-up by CT in 3 months. Stable small benign left adrenal adenoma. Left nephrolithiasis. No evidence of ureteral calculi or hydronephrosis."    Most recent lab results (10/18/2019) of CBC w/diff and CMP is as follows: all values are WNL except for RDW at 40.3, Glucose at 117.   On review of systems, pt reports lower leg swelling, lightheadedness, back pain, fatigue and denies dysuria, hematuria, discolored urine, polyuria, fevers, chills, night sweats, unexpected weight loss, loss of appetite, diarrhea, constipation, abdominal pain, testicular pain/swelling and any other symptoms.    On PMHx the pt reports PE, Palpitations, HTN, Hypercholesterolemia, Ascending Aortic Aneurysm, Gout, Melanoma. On Social Hx the pt reports that he does not smoke cigarettes and does not drink alcohol.    INTERVAL HISTORY:  Kenneth [name] is a wonderful 70 y.o. male who is here for evaluation and management of low-grade Non-Hodgkin's Lymphoma. The patient's last visit with [place] was on 08/03/2020. The pt reports that he is doing well overall.  The pt reports no acute new symptoms.  No significant new lumps or bumps, shortness of breath  with routine activities or chest pain, abdominal distention.  He denies any fevers chills drenching night sweats or unexpected sudden weight loss. He does note some sweats at night when he has to take the extra sheets off to cool down.  He did follow-up with Dr. Stanford Breed to follow-up on his ascending thoracic aortic aneurysm.  Lab results today 02/01/2021 of CBC w/diff within normal  limits, CMP unremarkable and LDH within normal limits at 174.  On review of systems, pt reports no other acute new concerns.  MEDICAL HISTORY:  Past Medical History:  Diagnosis Date   Ascending aortic aneurysm ([place]) 09/04/2018   Dyspnea 09/03/2018   Elevated troponin 09/03/2018   Gout    Hypercholesteremia    Hypertension    Nephrolithiasis    Palpitations 09/04/2018   PE (pulmonary thromboembolism) ([place]) 09/04/2018   Pulmonary embolism ([place])     SURGICAL HISTORY: Past Surgical History:  Procedure Laterality Date   No prior surgery      SOCIAL HISTORY: Social History   Socioeconomic History   Marital status: Married    Spouse name: Not on file   Number of children: Not on file   Years of education: Not on file   Highest education level: Not on file  Occupational History   Not on file  Tobacco Use   Smoking status: Never   Smokeless tobacco: Never  Substance and Sexual Activity   Alcohol use: Never   Drug use: Never   Sexual activity: Not on file  Other Topics Concern   Not on file  Social History Narrative   Not on file   Social Determinants of Health   Financial Resource Strain: Not on file  Food Insecurity: Not on file  Transportation Needs: Not on file  Physical Activity: Not on file  Stress: Not on file  Social Connections: Not on file  Intimate Partner Violence: Not on file    FAMILY HISTORY: Family History  Problem Relation Age of Onset   Diabetes Mother    Hypertension Mother    CAD Brother        CABG at age 59    ALLERGIES:  is allergic to hctz [hydrochlorothiazide].  MEDICATIONS:  Current Outpatient Medications  Medication Sig Dispense Refill   allopurinol (ZYLOPRIM) 100 MG tablet Take 100 mg by mouth 2 (two) times daily.   3   apixaban (ELIQUIS) 5 MG TABS tablet Take 5 mg by mouth 2 (two) times daily.     calcium carbonate (TUMS - DOSED IN MG ELEMENTAL CALCIUM) 500 MG chewable tablet Chew 1 tablet by mouth at bedtime as needed for  indigestion or heartburn.     carvedilol (COREG) 12.5 MG tablet TAKE 1 TABLET BY MOUTH TWICE DAILY WITH A MEAL 180 tablet 1   cloNIDine (CATAPRES) 0.3 MG tablet Take 0.3 mg by mouth daily.     finasteride (PROSCAR) 5 MG tablet Take 5 mg by mouth daily.  2   hydrALAZINE (APRESOLINE) 50 MG tablet Take 1 tablet (50 mg total) by mouth 2 (two) times daily. 180 tablet 3   losartan (COZAAR) 100 MG tablet Take 100 mg by mouth daily.  5   rosuvastatin (CRESTOR) 40 MG tablet Take 1 tablet (40 mg total) by mouth daily at 6 PM. 30 tablet 0   No current facility-administered medications for this visit.    REVIEW OF SYSTEMS:   10 Point review of Systems was done is negative except as noted above.   PHYSICAL EXAMINATION: ECOG PERFORMANCE STATUS:  1 - Symptomatic but completely ambulatory  . There were no vitals filed for this visit.  There were no vitals filed for this visit.  .There is no height or weight on file to calculate BMI.    NAD GENERAL:alert, in no acute distress and comfortable SKIN: no acute rashes, no significant lesions EYES: conjunctiva are pink and non-injected, sclera anicteric OROPHARYNX: MMM, no exudates, no oropharyngeal erythema or ulceration NECK: supple, no JVD LYMPH:  no palpable lymphadenopathy in the cervical, axillary or inguinal regions LUNGS: clear to auscultation b/l with normal respiratory effort HEART: regular rate & rhythm ABDOMEN:  normoactive bowel sounds , non tender, not distended. Extremity: no pedal edema PSYCH: alert & oriented x 3 with fluent speech NEURO: no focal motor/sensory deficits   LABORATORY DATA:  I have reviewed the data as listed  . CBC Latest Ref Rng & Units 02/01/2021 08/03/2020 07/20/2020  WBC 4.0 - 10.5 K/uL 5.9 5.7 5.4  Hemoglobin 13.0 - 17.0 g/dL 13.4 13.6 14.2  Hematocrit 39.0 - 52.0 % 40.1 41.4 43.4  Platelets 150 - 400 K/uL 214 215 262    . CMP Latest Ref Rng & Units 02/01/2021 08/03/2020 07/20/2020  Glucose 70 - 99 mg/dL  105(H) 107(H) 104(H)  BUN 8 - 23 mg/dL 7(L) 9 8  Creatinine 0.61 - 1.24 mg/dL 0.92 0.93 0.83  Sodium 135 - 145 mmol/L 139 140 139  Potassium 3.5 - 5.1 mmol/L 4.5 4.2 4.2  Chloride 98 - 111 mmol/L 105 105 105  CO2 22 - 32 mmol/L '25 27 26  '$ Calcium 8.9 - 10.3 mg/dL 9.5 9.0 9.6  Total Protein 6.5 - 8.1 g/dL 7.1 7.1 7.7  Total Bilirubin 0.3 - 1.2 mg/dL 0.8 0.9 0.9  Alkaline Phos 38 - 126 U/L 43 42 48  AST 15 - 41 U/L '20 18 19  '$ ALT 0 - 44 U/L '18 18 18   '$ . Lab Results  Component Value Date   LDH 174 02/01/2021    12/16/19 of Surgical Pathology [phone])  RADIOGRAPHIC STUDIES: I have personally reviewed the radiological images as listed and agreed with the findings in the report. No results found.  CTA C/A/P 10/02/2019: IMPRESSION: 1. Stable aneurysmal dilatation of the ascending thoracic aorta. Recommend annual imaging followup by CTA or MRA. This recommendation follows 2010 ACCF/AHA/AATS/ACR/ASA/SCA/SCAI/SIR/STS/SVM Guidelines for the Diagnosis and Management of Patients with Thoracic Aortic Disease. Circulation. 2010; [id number]. Aortic aneurysm NOS (ICD10-I71.9) 2. Interval development of mediastinal and upper abdominal lymphadenopathy. Please correlate with any history of lymphoma or leukemia. Dedicated CT of the abdomen and pelvis may be useful to evaluate the extent of the abdominal adenopathy. 3. No evidence of pulmonary emboli on this exam. Resolution of the segmental lower lobe emboli seen previously.   These results will be called to the ordering clinician or representative by the Radiologist Assistant, and communication documented in the PACS or [employer].     Electronically Signed   By: Randa Ngo M.D.   On: 10/02/2019 17:00    ASSESSMENT & PLAN:   70 yo with   1) Low grade Non Hodgkins lymphoma Presented with generalized lymphadenopathy in the mediastinum and throughout abdomen and pelvis-  Bx ssuggesting low grade NHL -- likely  Marginal zone lymphoma vs FL  PLAN: -Discussed pt labwork today, 02/01/2021; CBC CMP and LDH within normal limits and unremarkable. -Patient has no lab or clinical evidence of symptomatic Non-Hodgkin's Lymphoma progression at this time.  -Recommend healthy diet, de-stressing, increasing water intake and physical activity. -  Recommend pt take Vitamin D 2000 IU daily.  -Will see back in 6 months   FOLLOW UP: RTC with Dr Irene Limbo with labs in 6 months   The total time spent in the appointment was 20 minutes and more than 50% was on counseling and direct patient cares.  All of the patient's questions were answered with apparent satisfaction. The patient knows to call the clinic with any problems, questions or concerns.   Sullivan Lone MD MS AAHIVMS [facility] Hematology/Oncology Physician [place]  01/30/2021 12:00 PM   I, Reinaldo Raddle, am acting as scribe for Dr. Sullivan Lone, MD.   .I have reviewed the above documentation for accuracy and completeness, and I agree with the above. [name] MD

## 2021-02-01 ENCOUNTER — Inpatient Hospital Stay: Payer: Medicare Other | Attending: Hematology

## 2021-02-01 ENCOUNTER — Inpatient Hospital Stay: Payer: Medicare Other | Admitting: Hematology

## 2021-02-01 ENCOUNTER — Other Ambulatory Visit: Payer: Self-pay

## 2021-02-01 VITALS — BP 173/76 | HR 58 | Temp 99.1°F | Resp 18 | Wt 259.2 lb

## 2021-02-01 DIAGNOSIS — Z85820 Personal history of malignant melanoma of skin: Secondary | ICD-10-CM | POA: Insufficient documentation

## 2021-02-01 DIAGNOSIS — C8580 Other specified types of non-Hodgkin lymphoma, unspecified site: Secondary | ICD-10-CM

## 2021-02-01 DIAGNOSIS — N503 Cyst of epididymis: Secondary | ICD-10-CM | POA: Insufficient documentation

## 2021-02-01 DIAGNOSIS — Z8572 Personal history of non-Hodgkin lymphomas: Secondary | ICD-10-CM | POA: Diagnosis not present

## 2021-02-01 DIAGNOSIS — Z87442 Personal history of urinary calculi: Secondary | ICD-10-CM | POA: Insufficient documentation

## 2021-02-01 DIAGNOSIS — Z86711 Personal history of pulmonary embolism: Secondary | ICD-10-CM | POA: Diagnosis not present

## 2021-02-01 LAB — CBC WITH DIFFERENTIAL/PLATELET
Abs Immature Granulocytes: 0.01 10*3/uL (ref 0.00–0.07)
Basophils Absolute: 0.1 10*3/uL (ref 0.0–0.1)
Basophils Relative: 1 %
Eosinophils Absolute: 0.2 10*3/uL (ref 0.0–0.5)
Eosinophils Relative: 4 %
HCT: 40.1 % (ref 39.0–52.0)
Hemoglobin: 13.4 g/dL (ref 13.0–17.0)
Immature Granulocytes: 0 %
Lymphocytes Relative: 23 %
Lymphs Abs: 1.3 10*3/uL (ref 0.7–4.0)
MCH: 28.4 pg (ref 26.0–34.0)
MCHC: 33.4 g/dL (ref 30.0–36.0)
MCV: 85 fL (ref 80.0–100.0)
Monocytes Absolute: 0.8 10*3/uL (ref 0.1–1.0)
Monocytes Relative: 14 %
Neutro Abs: 3.5 10*3/uL (ref 1.7–7.7)
Neutrophils Relative %: 58 %
Platelets: 214 10*3/uL (ref 150–400)
RBC: 4.72 MIL/uL (ref 4.22–5.81)
RDW: 13.1 % (ref 11.5–15.5)
WBC: 5.9 10*3/uL (ref 4.0–10.5)
nRBC: 0 % (ref 0.0–0.2)

## 2021-02-01 LAB — CMP (CANCER CENTER ONLY)
ALT: 18 U/L (ref 0–44)
AST: 20 U/L (ref 15–41)
Albumin: 4.1 g/dL (ref 3.5–5.0)
Alkaline Phosphatase: 43 U/L (ref 38–126)
Anion gap: 9 (ref 5–15)
BUN: 7 mg/dL — ABNORMAL LOW (ref 8–23)
CO2: 25 mmol/L (ref 22–32)
Calcium: 9.5 mg/dL (ref 8.9–10.3)
Chloride: 105 mmol/L (ref 98–111)
Creatinine: 0.92 mg/dL (ref 0.61–1.24)
GFR, Estimated: >60 mL/min (ref >60)
Glucose, Bld: 105 mg/dL — ABNORMAL HIGH (ref 70–99)
Potassium: 4.5 mmol/L (ref 3.5–5.1)
Sodium: 139 mmol/L (ref 135–145)
Total Bilirubin: 0.8 mg/dL (ref 0.3–1.2)
Total Protein: 7.1 g/dL (ref 6.5–8.1)

## 2021-02-01 LAB — LACTATE DEHYDROGENASE: LDH: 174 U/L (ref 98–192)

## 2021-05-06 NOTE — Progress Notes (Signed)
HPI: FU PAF and CAD. Pt admitted 2/20 with DOE; CTA showed bilateral subsegmental pulmonary emboli, dilated aortic root at 4.2 cm and coronary calcification. Troponin I 0.12. Echo showed normal LV function, G1DD, mild AI. Treated with apixaban. Also treated with statin for coronary calcification. Nuclear study July 2020 showed normal perfusion and ejection fraction 52%. Patient was noted to be in new onset atrial fibrillation.  Monitor January 2021 showed sinus rhythm with PACs and PVCs. CT January 2022 showed 4 x 4 centimeter ascending thoracic aortic aneurysm.  There was decrease in size of lymph nodes consistent with treatment of lymphoma.  Since last seen, there is no dyspnea, chest pain, or syncope.  Occasional "skipped" but no sustained palpitations.  No bleeding.  Current Outpatient Medications  Medication Sig Dispense Refill   allopurinol (ZYLOPRIM) 100 MG tablet Take 100 mg by mouth 2 (two) times daily.   3   apixaban (ELIQUIS) 5 MG TABS tablet Take 5 mg by mouth 2 (two) times daily.     calcium carbonate (TUMS - DOSED IN MG ELEMENTAL CALCIUM) 500 MG chewable tablet Chew 1 tablet by mouth at bedtime as needed for indigestion or heartburn.     carvedilol (COREG) 12.5 MG tablet TAKE 1 TABLET BY MOUTH TWICE DAILY WITH A MEAL 180 tablet 1   cloNIDine (CATAPRES) 0.3 MG tablet Take 0.3 mg by mouth daily.     finasteride (PROSCAR) 5 MG tablet Take 5 mg by mouth daily.  2   losartan (COZAAR) 100 MG tablet Take 100 mg by mouth daily.  5   rosuvastatin (CRESTOR) 40 MG tablet Take 1 tablet (40 mg total) by mouth daily at 6 PM. 30 tablet 0   hydrALAZINE (APRESOLINE) 50 MG tablet Take 1 tablet (50 mg total) by mouth 2 (two) times daily. 180 tablet 3   No current facility-administered medications for this visit.     Past Medical History:  Diagnosis Date   Ascending aortic aneurysm 09/04/2018   Dyspnea 09/03/2018   Elevated troponin 09/03/2018   Gout    Hypercholesteremia    Hypertension     Nephrolithiasis    Palpitations 09/04/2018   PE (pulmonary thromboembolism) ([place]) 09/04/2018   Pulmonary embolism ([facility])     Past Surgical History:  Procedure Laterality Date   No prior surgery      Social History   Socioeconomic History   Marital status: Married    Spouse name: Not on file   Number of children: Not on file   Years of education: Not on file   Highest education level: Not on file  Occupational History   Not on file  Tobacco Use   Smoking status: Never   Smokeless tobacco: Never  Substance and Sexual Activity   Alcohol use: Never   Drug use: Never   Sexual activity: Not on file  Other Topics Concern   Not on file  Social History Narrative   Not on file   Social Determinants of Health   Financial Resource Strain: Not on file  Food Insecurity: Not on file  Transportation Needs: Not on file  Physical Activity: Not on file  Stress: Not on file  Social Connections: Not on file  Intimate Partner Violence: Not on file    Family History  Problem Relation Age of Onset   Diabetes Mother    Hypertension Mother    CAD Brother        CABG at age 65    ROS: no fevers  or chills, productive cough, hemoptysis, dysphasia, odynophagia, melena, hematochezia, dysuria, hematuria, rash, seizure activity, orthopnea, PND, pedal edema, claudication. Remaining systems are negative.  Physical Exam: Well-developed well-nourished in no acute distress.  Skin is warm and dry.  HEENT is normal.  Neck is supple.  Chest is clear to auscultation with normal expansion.  Cardiovascular exam is regular rate and rhythm.  Abdominal exam nontender or distended. No masses palpated. Extremities show no edema. neuro grossly intact  A/P  1 paroxysmal atrial fibrillation-patient is in sinus rhythm today.  We will continue beta-blocker and apixaban.  2 history of thoracic aortic aneurysm-patient is scheduled for follow-up CTA January 2023.  3 coronary calcification-continue  statin.  4 hypertension-patient's blood pressure is controlled.  Continue present medical regimen.  5 hyperlipidemia-continue statin.  6 history of pulmonary embolus  7 palpitations-symptoms are controlled.  Continue beta-blocker at present dose.  8 non-Hodgkin's lymphoma-followed by oncology.  Kirk Ruths, MD

## 2021-05-13 ENCOUNTER — Encounter: Payer: Self-pay | Admitting: Cardiology

## 2021-05-13 ENCOUNTER — Ambulatory Visit: Payer: Medicare Other | Admitting: Cardiology

## 2021-05-13 ENCOUNTER — Other Ambulatory Visit: Payer: Self-pay

## 2021-05-13 VITALS — BP 128/70 | HR 58 | Ht 69.0 in | Wt 250.0 lb

## 2021-05-13 DIAGNOSIS — I712 Thoracic aortic aneurysm, without rupture, unspecified: Secondary | ICD-10-CM | POA: Diagnosis not present

## 2021-05-13 DIAGNOSIS — I10 Essential (primary) hypertension: Secondary | ICD-10-CM

## 2021-05-13 DIAGNOSIS — I2510 Atherosclerotic heart disease of native coronary artery without angina pectoris: Secondary | ICD-10-CM

## 2021-05-13 DIAGNOSIS — R002 Palpitations: Secondary | ICD-10-CM

## 2021-05-13 DIAGNOSIS — I480 Paroxysmal atrial fibrillation: Secondary | ICD-10-CM | POA: Diagnosis not present

## 2021-05-13 NOTE — Patient Instructions (Signed)

## 2021-08-03 ENCOUNTER — Other Ambulatory Visit: Payer: Self-pay | Admitting: Internal Medicine

## 2021-08-03 ENCOUNTER — Inpatient Hospital Stay: Payer: Medicare Other

## 2021-08-03 ENCOUNTER — Inpatient Hospital Stay: Payer: Medicare Other | Attending: Hematology | Admitting: Hematology

## 2021-08-03 ENCOUNTER — Other Ambulatory Visit: Payer: Self-pay

## 2021-08-03 VITALS — BP 206/80 | HR 72 | Temp 97.5°F | Resp 20 | Wt 253.1 lb

## 2021-08-03 DIAGNOSIS — Z79899 Other long term (current) drug therapy: Secondary | ICD-10-CM | POA: Insufficient documentation

## 2021-08-03 DIAGNOSIS — C8580 Other specified types of non-Hodgkin lymphoma, unspecified site: Secondary | ICD-10-CM

## 2021-08-03 DIAGNOSIS — I10 Essential (primary) hypertension: Secondary | ICD-10-CM

## 2021-08-03 DIAGNOSIS — Z86711 Personal history of pulmonary embolism: Secondary | ICD-10-CM | POA: Insufficient documentation

## 2021-08-03 DIAGNOSIS — Z8572 Personal history of non-Hodgkin lymphomas: Secondary | ICD-10-CM | POA: Diagnosis present

## 2021-08-03 DIAGNOSIS — Z7901 Long term (current) use of anticoagulants: Secondary | ICD-10-CM | POA: Insufficient documentation

## 2021-08-03 NOTE — Progress Notes (Signed)
HEMATOLOGY/ONCOLOGY CLINIC NOTE  Date of Service: 08/03/2021  Patient Care Team: [name], MD as PCP - General (Internal Medicine) Stanford Breed Denice Bors, MD as PCP - Cardiology (Cardiology)  CHIEF COMPLAINTS/PURPOSE OF CONSULTATION:  Follow-up for continued evaluation and management of non-Hodgkin's lymphoma  HISTORY OF PRESENTING ILLNESS:  Kenneth [name] is a wonderful 72 y.o. male who has been referred to [place] by Dr Wyatt Portela for evaluation and management of lymphadenopathy. The pt reports that he is doing well overall.    The pt reports that he began to have some back discomfort the day after his CT scan, but has otherwise felt well over the last 6 months. When he stands up, lays down, or walks he does not experience the pain. When he sits for a long period of time or slouches it begins to hurt. He denies any recent changes in his urine or urinary habits. He has previously had kidney stones. Pt also reports overall fatigue, but thinks that this is best explained by his advanced age. He has not had any recent infections. Pt has a cat that he sometimes sleeps in close quarters with. He received his COVID19 vaccine after his scan on 03/24, but prior to his scan on 04/29.   Pt does have some lower leg swelling in both legs which he attributes to weight gain. Pt has had leg swelling in the past during a Gout attack. Pt had PE in 02/20, for which no cause has been found, but there was concern that a blood clot from his lower extremities traveled to his lungs. He has continued using anti-coagulation therapy as prescribed. Pt had a localized Melanoma on his right shoulder that was removed 20 years ago. He was seen at [facility] and received a short series of injections. He last saw his Dermatologist three years ago and there were no worries of recurrence. Pt does have occasional lightheadedness, about once per week, that is improved after eating. About 3-4 years ago he was having episodes where he felt that  he had to focus on breathing because he was not breathing automatically. Pt has had an epididymal cyst for several years that has been unchanging.    Of note prior to the patient's visit today, pt has had CT Abd/Pel ([id number]) completed on 11/07/2019 with results revealing "Mild lymphadenopathy throughout the abdomen and pelvis, new since 2011 exam. Suggest correlation for clinical/laboratory evidence of lymphoproliferative disorder, and consider short-term follow-up by CT in 3 months. Stable small benign left adrenal adenoma. Left nephrolithiasis. No evidence of ureteral calculi or hydronephrosis."    Most recent lab results (10/18/2019) of CBC w/diff and CMP is as follows: all values are WNL except for RDW at 40.3, Glucose at 117.   On review of systems, pt reports lower leg swelling, lightheadedness, back pain, fatigue and denies dysuria, hematuria, discolored urine, polyuria, fevers, chills, night sweats, unexpected weight loss, loss of appetite, diarrhea, constipation, abdominal pain, testicular pain/swelling and any other symptoms.    On PMHx the pt reports PE, Palpitations, HTN, Hypercholesterolemia, Ascending Aortic Aneurysm, Gout, Melanoma. On Social Hx the pt reports that he does not smoke cigarettes and does not drink alcohol.    INTERVAL HISTORY:  Kenneth [name] is here for follow-up of his low-grade non-Hodgkin's lymphoma after his last clinic visit about 6 months ago.   Notes no acute notes.  No new lumps or bumps. No fevers no chills no night sweats. No unexpected weight loss.  Patient had a follow-up with  his cardiologist Dr. Stanford Breed for his thoracic aortic aneurysm atrial fibrillation and other cardiac management.  He notes a CTA of the chest abdomen pelvis has been ordered.  Labs today labs were recently done with his primary care physician and patient brings in a copy which shows stable CBC and CMP.  MEDICAL HISTORY:  Past Medical History:  Diagnosis Date   Ascending  aortic aneurysm 09/04/2018   Dyspnea 09/03/2018   Elevated troponin 09/03/2018   Gout    Hypercholesteremia    Hypertension    Nephrolithiasis    Palpitations 09/04/2018   PE (pulmonary thromboembolism) ([place]) 09/04/2018   Pulmonary embolism ([facility])     SURGICAL HISTORY: Past Surgical History:  Procedure Laterality Date   No prior surgery      SOCIAL HISTORY: Social History   Socioeconomic History   Marital status: Married    Spouse name: Not on file   Number of children: Not on file   Years of education: Not on file   Highest education level: Not on file  Occupational History   Not on file  Tobacco Use   Smoking status: Never   Smokeless tobacco: Never  Substance and Sexual Activity   Alcohol use: Never   Drug use: Never   Sexual activity: Not on file  Other Topics Concern   Not on file  Social History Narrative   Not on file   Social Determinants of Health   Financial Resource Strain: Not on file  Food Insecurity: Not on file  Transportation Needs: Not on file  Physical Activity: Not on file  Stress: Not on file  Social Connections: Not on file  Intimate Partner Violence: Not on file    FAMILY HISTORY: Family History  Problem Relation Age of Onset   Diabetes Mother    Hypertension Mother    CAD Brother        CABG at age 13    ALLERGIES:  is allergic to hctz [hydrochlorothiazide].  MEDICATIONS:  Current Outpatient Medications  Medication Sig Dispense Refill   allopurinol (ZYLOPRIM) 100 MG tablet Take 100 mg by mouth 2 (two) times daily.   3   apixaban (ELIQUIS) 5 MG TABS tablet Take 5 mg by mouth 2 (two) times daily.     calcium carbonate (TUMS - DOSED IN MG ELEMENTAL CALCIUM) 500 MG chewable tablet Chew 1 tablet by mouth at bedtime as needed for indigestion or heartburn.     carvedilol (COREG) 12.5 MG tablet TAKE 1 TABLET BY MOUTH TWICE DAILY WITH A MEAL 180 tablet 1   cloNIDine (CATAPRES) 0.3 MG tablet Take 0.3 mg by mouth daily.     finasteride  (PROSCAR) 5 MG tablet Take 5 mg by mouth daily.  2   hydrALAZINE (APRESOLINE) 50 MG tablet Take 1 tablet (50 mg total) by mouth 2 (two) times daily. 180 tablet 3   losartan (COZAAR) 100 MG tablet Take 100 mg by mouth daily.  5   rosuvastatin (CRESTOR) 40 MG tablet Take 1 tablet (40 mg total) by mouth daily at 6 PM. 30 tablet 0   No current facility-administered medications for this visit.    REVIEW OF SYSTEMS:   .10 Point review of Systems was done is negative except as noted above.  PHYSICAL EXAMINATION: ECOG PERFORMANCE STATUS: 1 - Symptomatic but completely ambulatory  . Vitals:   08/03/21 1420  BP: (!) 206/80  Pulse: 72  Resp: 20  Temp: (!) 97.5 F (36.4 C)  SpO2: 97%    Filed Weights  08/03/21 1420  Weight: 253 lb 1.6 oz (114.8 kg)    .Body mass index is 37.38 kg/m.   Marland Kitchen GENERAL:alert, in no acute distress and comfortable SKIN: no acute rashes, no significant lesions EYES: conjunctiva are pink and non-injected, sclera anicteric OROPHARYNX: MMM, no exudates, no oropharyngeal erythema or ulceration NECK: supple, no JVD LYMPH:  no palpable lymphadenopathy in the cervical, axillary or inguinal regions LUNGS: clear to auscultation b/l with normal respiratory effort HEART: regular rate & rhythm ABDOMEN:  normoactive bowel sounds , non tender, not distended. Extremity: no pedal edema PSYCH: alert & oriented x 3 with fluent speech NEURO: no focal motor/sensory deficits   LABORATORY DATA:  I have reviewed the data as listed  . CBC Latest Ref Rng & Units 02/01/2021 08/03/2020 07/20/2020  WBC 4.0 - 10.5 K/uL 5.9 5.7 5.4  Hemoglobin 13.0 - 17.0 g/dL 13.4 13.6 14.2  Hematocrit 39.0 - 52.0 % 40.1 41.4 43.4  Platelets 150 - 400 K/uL 214 215 262    . CMP Latest Ref Rng & Units 02/01/2021 08/03/2020 07/20/2020  Glucose 70 - 99 mg/dL 105(H) 107(H) 104(H)  BUN 8 - 23 mg/dL 7(L) 9 8  Creatinine 0.61 - 1.24 mg/dL 0.92 0.93 0.83  Sodium 135 - 145 mmol/L 139 140 139   Potassium 3.5 - 5.1 mmol/L 4.5 4.2 4.2  Chloride 98 - 111 mmol/L 105 105 105  CO2 22 - 32 mmol/L 25 27 26   Calcium 8.9 - 10.3 mg/dL 9.5 9.0 9.6  Total Protein 6.5 - 8.1 g/dL 7.1 7.1 7.7  Total Bilirubin 0.3 - 1.2 mg/dL 0.8 0.9 0.9  Alkaline Phos 38 - 126 U/L 43 42 48  AST 15 - 41 U/L 20 18 19   ALT 0 - 44 U/L 18 18 18    . Lab Results  Component Value Date   LDH 174 02/01/2021    12/16/19 of Surgical Pathology [id number])  RADIOGRAPHIC STUDIES: I have personally reviewed the radiological images as listed and agreed with the findings in the report. No results found.  CTA C/A/P 10/02/2019: IMPRESSION: 1. Stable aneurysmal dilatation of the ascending thoracic aorta. Recommend annual imaging followup by CTA or MRA. This recommendation follows 2010 ACCF/AHA/AATS/ACR/ASA/SCA/SCAI/SIR/STS/SVM Guidelines for the Diagnosis and Management of Patients with Thoracic Aortic Disease. Circulation. 2010; 121: E268-T419. Aortic aneurysm NOS (ICD10-I71.9) 2. Interval development of mediastinal and upper abdominal lymphadenopathy. Please correlate with any history of lymphoma or leukemia. Dedicated CT of the abdomen and pelvis may be useful to evaluate the extent of the abdominal adenopathy. 3. No evidence of pulmonary emboli on this exam. Resolution of the segmental lower lobe emboli seen previously.   These results will be called to the ordering clinician or representative by the Radiologist Assistant, and communication documented in the PACS or [employer].     Electronically Signed   By: Randa Ngo M.D.   On: 10/02/2019 17:00    ASSESSMENT & PLAN:   71 yo with   1) Low grade Non Hodgkins lymphoma Presented with generalized lymphadenopathy in the mediastinum and throughout abdomen and pelvis-  Bx ssuggesting low grade NHL -- likely Marginal zone lymphoma vs FL  PLAN: -Reviewed patient's outside labs done with his primary care physician which show stable CBC and  CMP. -Patient has no lab or clinical evidence of progression of his Hodgkin's lymphoma at this time. -He is to follow-up with his cardiologist to get a CTA of his chest abdomen pelvis to evaluate his thoracic aortic aneurysm and this might allow [place]  to follow-up on his adenopathy as well. -No urgent hematologic reason for repeat imaging at this time. --Will see back in 6 months   FOLLOW UP: RTC with Dr Irene Limbo with labs in 6 months F/u with PCP/Cardiology for optimization of HTN Mx    All of the patient's questions were answered with apparent satisfaction. The patient knows to call the clinic with any problems, questions or concerns.   Sullivan Lone MD [place] AAHIVMS [facility] Hematology/Oncology Physician [place]  08/03/2021 2:27 PM

## 2021-08-04 ENCOUNTER — Telehealth: Payer: Self-pay | Admitting: Cardiology

## 2021-08-04 DIAGNOSIS — I712 Thoracic aortic aneurysm, without rupture, unspecified: Secondary | ICD-10-CM

## 2021-08-04 NOTE — Telephone Encounter (Signed)
Returned call to patient who states that he thought he was due for a repeat CTA aorta- do not see one ordered. Advised patient I would forward to Dr. Stanford Breed to see if okay to order. Patient verbalized understanding

## 2021-08-04 NOTE — Telephone Encounter (Signed)
Spoke with pt, aware order is placed for the CTA and someone will call him to schedule.

## 2021-08-04 NOTE — Telephone Encounter (Signed)
Patient is calling stating Hilda Blades called him last week to schedule his annual CTA. I was unable to find documentation in regards to this or an order in the system. He is requesting a callback in regards to this.

## 2021-08-10 ENCOUNTER — Ambulatory Visit
Admission: RE | Admit: 2021-08-10 | Discharge: 2021-08-10 | Disposition: A | Discharge status: Home / Self Care | Payer: Medicare Other | Source: Ambulatory Visit | Attending: Internal Medicine | Admitting: Internal Medicine

## 2021-08-10 ENCOUNTER — Ambulatory Visit
Admission: RE | Admit: 2021-08-10 | Discharge: 2021-08-10 | Disposition: A | Discharge status: Home / Self Care | Payer: Medicare Other | Source: Ambulatory Visit | Attending: Cardiology | Admitting: Cardiology

## 2021-08-10 ENCOUNTER — Telehealth: Payer: Self-pay | Admitting: Cardiology

## 2021-08-10 DIAGNOSIS — I10 Essential (primary) hypertension: Secondary | ICD-10-CM

## 2021-08-10 DIAGNOSIS — I712 Thoracic aortic aneurysm, without rupture, unspecified: Secondary | ICD-10-CM

## 2021-08-10 MED ORDER — IOPAMIDOL (ISOVUE-300) INJECTION 61%
80.0000 mL | Freq: Once | INTRAVENOUS | Status: AC | PRN
  Administered 2021-08-10: 80 mL via INTRAVENOUS

## 2021-08-10 NOTE — Telephone Encounter (Signed)
Spoke with pt, aware of results.  Results forwarded to oncology

## 2021-08-10 NOTE — Telephone Encounter (Signed)
°  Dianne - [place] [facility]ilogy calling to give CT report

## 2021-10-27 ENCOUNTER — Telehealth: Payer: Self-pay

## 2021-10-27 ENCOUNTER — Ambulatory Visit (INDEPENDENT_AMBULATORY_CARE_PROVIDER_SITE_OTHER): Payer: Medicare Other | Admitting: Orthopedic Surgery

## 2021-10-27 ENCOUNTER — Ambulatory Visit (INDEPENDENT_AMBULATORY_CARE_PROVIDER_SITE_OTHER): Payer: Medicare Other

## 2021-10-27 ENCOUNTER — Encounter: Payer: Self-pay | Admitting: Orthopedic Surgery

## 2021-10-27 VITALS — Ht 68.0 in | Wt 246.6 lb

## 2021-10-27 DIAGNOSIS — M25562 Pain in left knee: Secondary | ICD-10-CM

## 2021-10-27 DIAGNOSIS — M1712 Unilateral primary osteoarthritis, left knee: Secondary | ICD-10-CM

## 2021-10-27 NOTE — Telephone Encounter (Signed)
Noted  

## 2021-10-27 NOTE — Telephone Encounter (Signed)
Auth needed for right knee gel injection  

## 2021-10-28 NOTE — Progress Notes (Signed)
? ?Office Visit Note ?  ?Patient: Kenneth [name]           ?Date of Birth: 1950/07/24           ?MRN: [id number] ?Visit Date: 10/27/2021 ?Requested by: [name], MD ?[id number] ?[place],  [place] [address] ?PCP: [name], MD ? ?Subjective: ?Chief Complaint  ?Patient presents with  ? Left Knee - New Patient (Initial Visit)  ? ? ?HPI: [name] is a 71 year old patient with left knee pain.  No prior left knee surgeries.  Reports constant aching pain.  He had an injection 3 months ago but that has stopped helping.  He also reports bilateral thumb pain consistent with CMC arthritis.  Does not think much in the way of medication for pain.  Walking increases his symptoms.  Localizes most of the pain to the medial aspect of the left knee.  Difficult to get up and down stairs. ?             ?ROS: All systems reviewed are negative as they relate to the chief complaint within the history of present illness.  Patient denies  fevers or chills. ? ? ?Assessment & Plan: ?Visit Diagnoses:  ?1. Left knee pain, unspecified chronicity   ? ? ?Plan: Impression is left knee arthritis as well as bilateral CMC arthritis.  Plan is aspiration and injection of the knee today.  Come back in about 6 to 8 weeks for gel injection in the left knee.  Patient does not really want to think about knee replacement surgery.  We will also inject both CMC joints at that time under ultrasound guidance. ?This patient is diagnosed with osteoarthritis of the knee(s).   ? ?Radiographs show evidence of joint space narrowing, osteophytes, subchondral sclerosis and/or subchondral cysts.  This patient has knee pain which interferes with functional and activities of daily living.   ? ?This patient has experienced inadequate response, adverse effects and/or intolerance with conservative treatments such as acetaminophen, NSAIDS, topical creams, physical therapy or regular exercise, knee bracing and/or weight loss.  ? ?This patient has experienced inadequate  response or has a contraindication to intra articular steroid injections for at least 3 months.  ? ?This patient is not scheduled to have a total knee replacement within 6 months of starting treatment with viscosupplementation. ? ?Follow-Up Instructions: No follow-ups on file.  ? ?Orders:  ?Orders Placed This Encounter  ?Procedures  ? XR KNEE 3 VIEW LEFT  ? ?No orders of the defined types were placed in this encounter. ? ? ? ? Procedures: ?Large Joint Inj: L knee: L knee on 10/27/2021 8:06 AM ?Indications: diagnostic evaluation, joint swelling and pain ?Details: 18 G 1.5 in needle, superolateral approach ? ?Arthrogram: No ? ?Medications: 5 mL lidocaine 1 %; 40 mg methylPREDNISolone acetate 40 MG/ML; 4 mL bupivacaine 0.25 % ?Outcome: tolerated well, no immediate complications ?Procedure, treatment alternatives, risks and benefits explained, specific risks discussed. Consent was given by the patient. Immediately prior to procedure a time out was called to verify the correct patient, procedure, equipment, support staff and site/side marked as required. Patient was prepped and draped in the usual sterile fashion.  ? ? ? ? ?Clinical Data: ?No additional findings. ? ?Objective: ?Vital Signs: Ht '5\' 8"'$  (1.727 m)   Wt 246 lb 9.6 oz (111.9 kg)   BMI 37.50 kg/m?  ? ?Physical Exam:  ? ?Constitutional: Patient appears well-developed ?HEENT:  ?Head: Normocephalic ?Eyes:EOM are normal ?Neck: Normal range of motion ?Cardiovascular: Normal rate ?Pulmonary/chest: Effort  normal ?Neurologic: Patient is alert ?Skin: Skin is warm ?Psychiatric: Patient has normal mood and affect ? ?Ortho Exam: Ortho exam demonstrates positive grind test in both thumbs.  EPL FPL interosseous strength intact.  No radial styloid tenderness no snuffbox tenderness in either wrist.  Grip strength symmetric bilaterally but Pinch strength is painful in both hands. ? ?Left knee is examined.  Mild effusion is present.  5 degree flexion contracture but can bend to  about 120.  Collateral cruciate ligaments are stable.  No masses lymphadenopathy or skin changes noted in that left knee region.  No groin pain with internal/external rotation of the leg.  Pedal pulses palpable. ? ?Specialty Comments:  ?No specialty comments available. ? ?Imaging: ?XR KNEE 3 VIEW LEFT ? ?Result Date: 10/27/2021 ?AP lateral merchant radiographs left knee reviewed.  Varus alignment is present.  Severe end-stage tricompartmental arthritis is present as well with bone-on-bone changes and most severe changes in the medial compartment.  Loose bodies noted in the posterior aspect of the knee.  Patella height normal relative to distal femur.  No acute fractures  ? ? ?PMFS History: ?Patient Active Problem List  ? Diagnosis Date Noted  ? Ascending aortic aneurysm ([place]) 09/04/2018  ? PE (pulmonary thromboembolism) ([place]) 09/04/2018  ? Palpitations 09/04/2018  ? Hypertension   ? Hypertensive urgency 09/03/2018  ? Elevated troponin 09/03/2018  ? Dyspnea 09/03/2018  ? ?Past Medical History:  ?Diagnosis Date  ? Ascending aortic aneurysm ([place]) 09/04/2018  ? Dyspnea 09/03/2018  ? Elevated troponin 09/03/2018  ? Gout   ? Hypercholesteremia   ? Hypertension   ? Nephrolithiasis   ? Palpitations 09/04/2018  ? PE (pulmonary thromboembolism) ([place]) 09/04/2018  ? Pulmonary embolism ([place])   ?  ?Family History  ?Problem Relation Age of Onset  ? Diabetes Mother   ? Hypertension Mother   ? CAD Brother   ?     CABG at age 29  ?  ?Past Surgical History:  ?Procedure Laterality Date  ? No prior surgery    ? ?Social History  ? ?Occupational History  ? Not on file  ?Tobacco Use  ? Smoking status: Never  ? Smokeless tobacco: Never  ?Substance and Sexual Activity  ? Alcohol use: Never  ? Drug use: Never  ? Sexual activity: Not on file  ? ? ? ? ? ?

## 2021-10-29 MED ORDER — LIDOCAINE HCL 1 % IJ SOLN
5.0000 mL | INTRAMUSCULAR | Status: AC | PRN
  Administered 2021-10-27: 5 mL

## 2021-10-29 MED ORDER — METHYLPREDNISOLONE ACETATE 40 MG/ML IJ SUSP
40.0000 mg | INTRAMUSCULAR | Status: AC | PRN
  Administered 2021-10-27: 40 mg via INTRA_ARTICULAR

## 2021-10-29 MED ORDER — BUPIVACAINE HCL 0.25 % IJ SOLN
4.0000 mL | INTRAMUSCULAR | Status: AC | PRN
  Administered 2021-10-27: 4 mL via INTRA_ARTICULAR

## 2022-01-27 ENCOUNTER — Other Ambulatory Visit: Payer: Self-pay

## 2022-01-27 DIAGNOSIS — C8580 Other specified types of non-Hodgkin lymphoma, unspecified site: Secondary | ICD-10-CM

## 2022-01-31 ENCOUNTER — Inpatient Hospital Stay: Payer: Medicare Other

## 2022-01-31 ENCOUNTER — Other Ambulatory Visit: Payer: Self-pay

## 2022-01-31 ENCOUNTER — Inpatient Hospital Stay: Payer: Medicare Other | Attending: Hematology | Admitting: Hematology

## 2022-01-31 VITALS — BP 150/73 | HR 65 | Temp 97.9°F | Resp 20 | Wt 249.2 lb

## 2022-01-31 DIAGNOSIS — Z7901 Long term (current) use of anticoagulants: Secondary | ICD-10-CM | POA: Insufficient documentation

## 2022-01-31 DIAGNOSIS — Z79899 Other long term (current) drug therapy: Secondary | ICD-10-CM | POA: Insufficient documentation

## 2022-01-31 DIAGNOSIS — Z86711 Personal history of pulmonary embolism: Secondary | ICD-10-CM | POA: Insufficient documentation

## 2022-01-31 DIAGNOSIS — C8580 Other specified types of non-Hodgkin lymphoma, unspecified site: Secondary | ICD-10-CM

## 2022-01-31 DIAGNOSIS — Z8572 Personal history of non-Hodgkin lymphomas: Secondary | ICD-10-CM | POA: Diagnosis present

## 2022-01-31 LAB — LACTATE DEHYDROGENASE: LDH: 155 U/L (ref 98–192)

## 2022-01-31 LAB — CBC WITH DIFFERENTIAL (CANCER CENTER ONLY)
Abs Immature Granulocytes: 0.01 10*3/uL (ref 0.00–0.07)
Basophils Absolute: 0 10*3/uL (ref 0.0–0.1)
Basophils Relative: 1 %
Eosinophils Absolute: 0.4 10*3/uL (ref 0.0–0.5)
Eosinophils Relative: 7 %
HCT: 38.8 % — ABNORMAL LOW (ref 39.0–52.0)
Hemoglobin: 13.3 g/dL (ref 13.0–17.0)
Immature Granulocytes: 0 %
Lymphocytes Relative: 26 %
Lymphs Abs: 1.6 10*3/uL (ref 0.7–4.0)
MCH: 28.9 pg (ref 26.0–34.0)
MCHC: 34.3 g/dL (ref 30.0–36.0)
MCV: 84.3 fL (ref 80.0–100.0)
Monocytes Absolute: 0.9 10*3/uL (ref 0.1–1.0)
Monocytes Relative: 15 %
Neutro Abs: 3.2 10*3/uL (ref 1.7–7.7)
Neutrophils Relative %: 51 %
Platelet Count: 228 10*3/uL (ref 150–400)
RBC: 4.6 MIL/uL (ref 4.22–5.81)
RDW: 12.9 % (ref 11.5–15.5)
WBC Count: 6.2 10*3/uL (ref 4.0–10.5)
nRBC: 0 % (ref 0.0–0.2)

## 2022-01-31 LAB — CMP (CANCER CENTER ONLY)
ALT: 16 U/L (ref 0–44)
AST: 19 U/L (ref 15–41)
Albumin: 4.3 g/dL (ref 3.5–5.0)
Alkaline Phosphatase: 40 U/L (ref 38–126)
Anion gap: 4 — ABNORMAL LOW (ref 5–15)
BUN: 10 mg/dL (ref 8–23)
CO2: 27 mmol/L (ref 22–32)
Calcium: 9.1 mg/dL (ref 8.9–10.3)
Chloride: 107 mmol/L (ref 98–111)
Creatinine: 0.91 mg/dL (ref 0.61–1.24)
GFR, Estimated: >60 mL/min (ref >60)
Glucose, Bld: 97 mg/dL (ref 70–99)
Potassium: 4.5 mmol/L (ref 3.5–5.1)
Sodium: 138 mmol/L (ref 135–145)
Total Bilirubin: 0.6 mg/dL (ref 0.3–1.2)
Total Protein: 6.8 g/dL (ref 6.5–8.1)

## 2022-01-31 NOTE — Progress Notes (Signed)
HEMATOLOGY/ONCOLOGY CLINIC NOTE  Date of Service: 01/31/2022  Patient Care Team: [name], MD as PCP - General (Internal Medicine) Stanford Breed Denice Bors, MD as PCP - Cardiology (Cardiology)  CHIEF COMPLAINTS/PURPOSE OF CONSULTATION:  Follow-up for continued evaluation and management of non-Hodgkin's lymphoma  HISTORY OF PRESENTING ILLNESS:  Kenneth [name] is a wonderful 71 y.o. male who has been referred to [place] by Dr Wyatt Portela for evaluation and management of lymphadenopathy. The pt reports that he is doing well overall.    The pt reports that he began to have some back discomfort the day after his CT scan, but has otherwise felt well over the last 6 months. When he stands up, lays down, or walks he does not experience the pain. When he sits for a long period of time or slouches it begins to hurt. He denies any recent changes in his urine or urinary habits. He has previously had kidney stones. Pt also reports overall fatigue, but thinks that this is best explained by his advanced age. He has not had any recent infections. Pt has a cat that he sometimes sleeps in close quarters with. He received his COVID19 vaccine after his scan on 03/24, but prior to his scan on 04/29.   Pt does have some lower leg swelling in both legs which he attributes to weight gain. Pt has had leg swelling in the past during a Gout attack. Pt had PE in 02/20, for which no cause has been found, but there was concern that a blood clot from his lower extremities traveled to his lungs. He has continued using anti-coagulation therapy as prescribed. Pt had a localized Melanoma on his right shoulder that was removed 20 years ago. He was seen at [facility] and received a short series of injections. He last saw his Dermatologist three years ago and there were no worries of recurrence. Pt does have occasional lightheadedness, about once per week, that is improved after eating. About 3-4 years ago he was having episodes where he felt that  he had to focus on breathing because he was not breathing automatically. Pt has had an epididymal cyst for several years that has been unchanging.    Of note prior to the patient's visit today, pt has had CT Abd/Pel ([id number]) completed on 11/07/2019 with results revealing "Mild lymphadenopathy throughout the abdomen and pelvis, new since 2011 exam. Suggest correlation for clinical/laboratory evidence of lymphoproliferative disorder, and consider short-term follow-up by CT in 3 months. Stable small benign left adrenal adenoma. Left nephrolithiasis. No evidence of ureteral calculi or hydronephrosis."    Most recent lab results (10/18/2019) of CBC w/diff and CMP is as follows: all values are WNL except for RDW at 40.3, Glucose at 117.   On review of systems, pt reports lower leg swelling, lightheadedness, back pain, fatigue and denies dysuria, hematuria, discolored urine, polyuria, fevers, chills, night sweats, unexpected weight loss, loss of appetite, diarrhea, constipation, abdominal pain, testicular pain/swelling and any other symptoms.    On PMHx the pt reports PE, Palpitations, HTN, Hypercholesterolemia, Ascending Aortic Aneurysm, Gout, Melanoma. On Social Hx the pt reports that he does not smoke cigarettes and does not drink alcohol.    INTERVAL HISTORY: Kenneth [name] is here for follow-up of his low-grade non-Hodgkin's lymphoma. He reports He is doing well with no new symptoms or concerns.  He notes his energy levels have been good lately.    He notes that he would like to move out of the country potentially. We  discussed this would be possible as long as he gets regular medical follow ups.  No fever, chills, night sweats. No new lumps, bumps, or lesions/rashes. No abdominal pain or change in bowel habits. No new or unexpected weight loss. No black or bloody stools. No other new or acute focal symptoms.  Labs done today were reviewed in detail.  MEDICAL HISTORY:  Past Medical  History:  Diagnosis Date   Ascending aortic aneurysm ([place]) 09/04/2018   Dyspnea 09/03/2018   Elevated troponin 09/03/2018   Gout    Hypercholesteremia    Hypertension    Nephrolithiasis    Palpitations 09/04/2018   PE (pulmonary thromboembolism) ([place]) 09/04/2018   Pulmonary embolism ([place])     SURGICAL HISTORY: Past Surgical History:  Procedure Laterality Date   No prior surgery      SOCIAL HISTORY: Social History   Socioeconomic History   Marital status: Married    Spouse name: Not on file   Number of children: Not on file   Years of education: Not on file   Highest education level: Not on file  Occupational History   Not on file  Tobacco Use   Smoking status: Never   Smokeless tobacco: Never  Substance and Sexual Activity   Alcohol use: Never   Drug use: Never   Sexual activity: Not on file  Other Topics Concern   Not on file  Social History Narrative   Not on file   Social Determinants of Health   Financial Resource Strain: Not on file  Food Insecurity: Not on file  Transportation Needs: Not on file  Physical Activity: Not on file  Stress: Not on file  Social Connections: Not on file  Intimate Partner Violence: Not on file    FAMILY HISTORY: Family History  Problem Relation Age of Onset   Diabetes Mother    Hypertension Mother    CAD Brother        CABG at age 51    ALLERGIES:  is allergic to hctz [hydrochlorothiazide].  MEDICATIONS:  Current Outpatient Medications  Medication Sig Dispense Refill   allopurinol (ZYLOPRIM) 100 MG tablet Take 100 mg by mouth 2 (two) times daily.   3   apixaban (ELIQUIS) 5 MG TABS tablet Take 5 mg by mouth 2 (two) times daily.     calcium carbonate (TUMS - DOSED IN MG ELEMENTAL CALCIUM) 500 MG chewable tablet [name] 1 tablet by mouth at bedtime as needed for indigestion or heartburn.     carvedilol (COREG) 12.5 MG tablet TAKE 1 TABLET BY MOUTH TWICE DAILY WITH A MEAL 180 tablet 1   cloNIDine (CATAPRES) 0.3 MG tablet Take  0.3 mg by mouth daily.     finasteride (PROSCAR) 5 MG tablet Take 5 mg by mouth daily.  2   hydrALAZINE (APRESOLINE) 50 MG tablet Take 1 tablet (50 mg total) by mouth 2 (two) times daily. 180 tablet 3   losartan (COZAAR) 100 MG tablet Take 100 mg by mouth daily.  5   rosuvastatin (CRESTOR) 40 MG tablet Take 1 tablet (40 mg total) by mouth daily at 6 PM. 30 tablet 0   No current facility-administered medications for this visit.    REVIEW OF SYSTEMS:   .10 Point review of Systems was done is negative except as noted above.  PHYSICAL EXAMINATION: ECOG PERFORMANCE STATUS: 1 - Symptomatic but completely ambulatory  . Vitals:   01/31/22 1500  BP: (!) 150/73  Pulse: 65  Resp: 20  Temp: 97.9 F (36.6 C)  SpO2: 98%    Filed Weights   01/31/22 1500  Weight: 249 lb 3.2 oz (113 kg)    .Body mass index is 37.89 kg/m.  NAD GENERAL:alert, in no acute distress and comfortable SKIN: no acute rashes, no significant lesions EYES: conjunctiva are pink and non-injected, sclera anicteric NECK: supple, no JVD LYMPH:  no palpable lymphadenopathy in the cervical, axillary or inguinal regions LUNGS: clear to auscultation b/l with normal respiratory effort HEART: regular rate & rhythm ABDOMEN:  normoactive bowel sounds , non tender, not distended. Extremity: no pedal edema PSYCH: alert & oriented x 3 with fluent speech NEURO: no focal motor/sensory deficits  LABORATORY DATA:  I have reviewed the data as listed  .    Latest Ref Rng & Units 01/31/2022    2:31 PM 02/01/2021    1:47 PM 08/03/2020   11:27 AM  CBC  WBC 4.0 - 10.5 K/uL 6.2  5.9  5.7   Hemoglobin 13.0 - 17.0 g/dL 13.3  13.4  13.6   Hematocrit 39.0 - 52.0 % 38.8  40.1  41.4   Platelets 150 - 400 K/uL 228  214  215     .    Latest Ref Rng & Units 01/31/2022    2:31 PM 02/01/2021    1:47 PM 08/03/2020   11:27 AM  CMP  Glucose 70 - 99 mg/dL 97  105  107   BUN 8 - 23 mg/dL '10  7  9   '$ Creatinine 0.61 - 1.24 mg/dL 0.91  0.92   0.93   Sodium 135 - 145 mmol/L 138  139  140   Potassium 3.5 - 5.1 mmol/L 4.5  4.5  4.2   Chloride 98 - 111 mmol/L 107  105  105   CO2 22 - 32 mmol/L '27  25  27   '$ Calcium 8.9 - 10.3 mg/dL 9.1  9.5  9.0   Total Protein 6.5 - 8.1 g/dL 6.8  7.1  7.1   Total Bilirubin 0.3 - 1.2 mg/dL 0.6  0.8  0.9   Alkaline Phos 38 - 126 U/L 40  43  42   AST 15 - 41 U/L '19  20  18   '$ ALT 0 - 44 U/L '16  18  18    '$ . Lab Results  Component Value Date   LDH 155 01/31/2022    12/16/19 of Surgical Pathology [phone])  RADIOGRAPHIC STUDIES: I have personally reviewed the radiological images as listed and agreed with the findings in the report. No results found.  CTA C/A/P 10/02/2019: IMPRESSION: 1. Stable aneurysmal dilatation of the ascending thoracic aorta. Recommend annual imaging followup by CTA or MRA. This recommendation follows 2010 ACCF/AHA/AATS/ACR/ASA/SCA/SCAI/SIR/STS/SVM Guidelines for the Diagnosis and Management of Patients with Thoracic Aortic Disease. Circulation. 2010; 121: Z300-T622. Aortic aneurysm NOS (ICD10-I71.9) 2. Interval development of mediastinal and upper abdominal lymphadenopathy. Please correlate with any history of lymphoma or leukemia. Dedicated CT of the abdomen and pelvis may be useful to evaluate the extent of the abdominal adenopathy. 3. No evidence of pulmonary emboli on this exam. Resolution of the segmental lower lobe emboli seen previously.   These results will be called to the ordering clinician or representative by the Radiologist Assistant, and communication documented in the PACS or [employer].     Electronically Signed   By: Randa Ngo M.D.   On: 10/02/2019 17:00    ASSESSMENT & PLAN:   71 yo with   1) Low grade Non Hodgkins lymphoma Presented with  generalized lymphadenopathy in the mediastinum and throughout abdomen and pelvis-  Bx ssuggesting low grade NHL -- likely Marginal zone lymphoma vs FL  PLAN: -Labs done today were  reviewed in detail. CBC and CMP unremarkable. LDH at 155. -Patient has no lab or clinical evidence of progression of his Hodgkin's lymphoma at this time. -No urgent hematologic reason for repeat imaging at this time. -He notes that he would like to move out of the country potentially. We discussed this would be possible as long as he gets regular medical follow ups. -he shall be getting a CTA chest/abd/pelvis with surgery to monitor his Aortic aneurysm and we shall review this imaging for progression of LNadenopathy on next visit.  FOLLOW UP: RTC with Dr Irene Limbo with labs in 6 months   .The total time spent in the appointment was 20 minutes* .  All of the patient's questions were answered with apparent satisfaction. The patient knows to call the clinic with any problems, questions or concerns.   Sullivan Lone MD MS AAHIVMS [facility] Hematology/Oncology Physician [facility]  .*Total Encounter Time as defined by the Centers for Medicare and Medicaid Services includes, in addition to the face-to-face time of a patient visit (documented in the note above) non-face-to-face time: obtaining and reviewing outside history, ordering and reviewing medications, tests or procedures, care coordination (communications with other health care professionals or caregivers) and documentation in the medical record.   I, Melene Muller, am acting as scribe for Dr. Sullivan Lone, MD.  .I have reviewed the above documentation for accuracy and completeness, and I agree with the above. [name] MD

## 2022-02-02 ENCOUNTER — Encounter: Payer: Self-pay | Admitting: Orthopedic Surgery

## 2022-02-02 ENCOUNTER — Ambulatory Visit: Payer: Medicare Other | Admitting: Orthopedic Surgery

## 2022-02-02 ENCOUNTER — Ambulatory Visit: Payer: Self-pay

## 2022-02-02 ENCOUNTER — Ambulatory Visit (INDEPENDENT_AMBULATORY_CARE_PROVIDER_SITE_OTHER): Payer: Medicare Other

## 2022-02-02 ENCOUNTER — Telehealth: Payer: Self-pay

## 2022-02-02 DIAGNOSIS — M25532 Pain in left wrist: Secondary | ICD-10-CM | POA: Diagnosis not present

## 2022-02-02 DIAGNOSIS — M25531 Pain in right wrist: Secondary | ICD-10-CM

## 2022-02-02 DIAGNOSIS — M1712 Unilateral primary osteoarthritis, left knee: Secondary | ICD-10-CM | POA: Diagnosis not present

## 2022-02-02 MED ORDER — LIDOCAINE HCL 1 % IJ SOLN
5.0000 mL | INTRAMUSCULAR | Status: AC | PRN
  Administered 2022-02-02: 5 mL

## 2022-02-02 NOTE — Progress Notes (Signed)
Office Visit Note   Patient: Kenneth [name]           Date of Birth: 1950/11/13           MRN: [id number] Visit Date: 02/02/2022 Requested by: Kenneth Panda, MD [id number],  [place] [address] PCP: Kenneth Panda, MD  Subjective: Chief Complaint  Patient presents with   Left Knee - Pain    HPI: Kenneth [name] is a 71 year old patient with left knee pain.  Had injection for 1923.  Did have good relief for about a month and a half.  Was able to get a few things done.  Would like to have repeat aspiration done today but not cortisone injection.  Would like to consider gel injections.  Also reports bilateral base of the thumb pain which is worse with pinching.  That has been going on for several months.  Describes weakness as well as dropping things.              ROS: All systems reviewed are negative as they relate to the chief complaint within the history of present illness.  Patient denies  fevers or chills.   Assessment & Plan: Visit Diagnoses:  1. Bilateral wrist pain     Plan: Impression is bilateral wrist pain with CMC arthritis present.  Plan is CMC injection possibly under ultrasound next visit depending on his symptoms.  The left knee is aspirated today.  Gel injection requested for left knee as well.  Follow-up with the aspiration affect diminishes.  Follow-Up Instructions: Return if symptoms worsen or fail to improve.   Orders:  Orders Placed This Encounter  Procedures   XR Wrist 2 Views Right   XR Wrist 2 Views Left   No orders of the defined types were placed in this encounter.     Procedures: Large Joint Inj: L knee on 02/02/2022 7:01 PM Indications: diagnostic evaluation, joint swelling and pain Details: 18 G 1.5 in needle, superolateral approach  Arthrogram: No  Medications: 5 mL lidocaine 1 % Aspirate: 40 mL serous Outcome: tolerated well, no immediate complications Procedure, treatment alternatives, risks and benefits explained, specific risks  discussed. Consent was given by the patient. Immediately prior to procedure a time out was called to verify the correct patient, procedure, equipment, support staff and site/side marked as required. Patient was prepped and draped in the usual sterile fashion.     This patient is diagnosed with osteoarthritis of the knee(s).    Radiographs show evidence of joint space narrowing, osteophytes, subchondral sclerosis and/or subchondral cysts.  This patient has knee pain which interferes with functional and activities of daily living.    This patient has experienced inadequate response, adverse effects and/or intolerance with conservative treatments such as acetaminophen, NSAIDS, topical creams, physical therapy or regular exercise, knee bracing and/or weight loss.   This patient has experienced inadequate response or has a contraindication to intra articular steroid injections for at least 3 months.   This patient is not scheduled to have a total knee replacement within 6 months of starting treatment with viscosupplementation.   Clinical Data: No additional findings.  Objective: Vital Signs: There were no vitals taken for this visit.  Physical Exam:   Constitutional: Patient appears well-developed HEENT:  Head: Normocephalic Eyes:EOM are normal Neck: Normal range of motion Cardiovascular: Normal rate Pulmonary/chest: Effort normal Neurologic: Patient is alert Skin: Skin is warm Psychiatric: Patient has normal mood and affect   Ortho Exam: Ortho exam demonstrates full active and passive range  of motion of the wrist.  Does have positive grind test bilaterally but intact EPL FPL interosseous function.  Radial pulse intact.  No tenderness over the radial styloid.  Left knee demonstrates moderate effusion with intact extensor mechanism varus alignment palpable pedal pulses and no groin pain with internal/external Tatian of the leg.  Does have medial greater than lateral joint line  tenderness.  Specialty Comments:  No specialty comments available.  Imaging: XR Wrist 2 Views Right  Result Date: 02/02/2022 AP lateral radiographs right wrist reviewed.  CMC arthritis is present.  No fractures.  Radiocarpal alignment intact.  No erosions or accessory calcification.  XR Wrist 2 Views Left  Result Date: 02/02/2022 AP lateral radiographs left wrist reviewed.  CMC arthritis is present.  No radiocarpal joint arthritis present no fracture.  Bony alignment intact.  No erosions or calcifications.    PMFS History: Patient Active Problem List   Diagnosis Date Noted   Ascending aortic aneurysm ([place]) 09/04/2018   PE (pulmonary thromboembolism) ([place]) 09/04/2018   Palpitations 09/04/2018   Hypertension    Hypertensive urgency 09/03/2018   Elevated troponin 09/03/2018   Dyspnea 09/03/2018   Past Medical History:  Diagnosis Date   Ascending aortic aneurysm ([place]) 09/04/2018   Dyspnea 09/03/2018   Elevated troponin 09/03/2018   Gout    Hypercholesteremia    Hypertension    Nephrolithiasis    Palpitations 09/04/2018   PE (pulmonary thromboembolism) ([place]) 09/04/2018   Pulmonary embolism ([place])     Family History  Problem Relation Age of Onset   Diabetes Mother    Hypertension Mother    CAD Brother        CABG at age 60    Past Surgical History:  Procedure Laterality Date   No prior surgery     Social History   Occupational History   Not on file  Tobacco Use   Smoking status: Never   Smokeless tobacco: Never  Substance and Sexual Activity   Alcohol use: Never   Drug use: Never   Sexual activity: Not on file

## 2022-02-02 NOTE — Telephone Encounter (Signed)
Auth needed for left knee gel injection  

## 2022-02-03 ENCOUNTER — Telehealth: Payer: Self-pay | Admitting: Hematology

## 2022-02-03 NOTE — Telephone Encounter (Signed)
Left message with follow-up appointment per 7/24 los.

## 2022-02-07 NOTE — Telephone Encounter (Signed)
Noted  

## 2022-02-08 ENCOUNTER — Telehealth: Payer: Self-pay

## 2022-02-08 NOTE — Telephone Encounter (Signed)
VOB submitted for [place], left knee

## 2022-02-08 NOTE — Telephone Encounter (Signed)
VOB submitted for [place], left knee.  

## 2022-04-11 ENCOUNTER — Ambulatory Visit: Payer: Medicare Other | Admitting: Surgical

## 2022-04-11 DIAGNOSIS — M1712 Unilateral primary osteoarthritis, left knee: Secondary | ICD-10-CM

## 2022-04-17 ENCOUNTER — Encounter: Payer: Self-pay | Admitting: Orthopedic Surgery

## 2022-04-17 MED ORDER — LIDOCAINE HCL 1 % IJ SOLN
5.0000 mL | INTRAMUSCULAR | Status: AC | PRN
  Administered 2022-04-11: 5 mL

## 2022-04-17 MED ORDER — SODIUM HYALURONATE 60 MG/3ML IX PRSY
60.0000 mg | PREFILLED_SYRINGE | INTRA_ARTICULAR | Status: AC | PRN
  Administered 2022-04-11: 60 mg via INTRA_ARTICULAR

## 2022-04-17 NOTE — Progress Notes (Signed)
   Procedure Note  Patient: EGYPT MARCHIANO             Date of Birth: [birth year]           MRN: [id number]             Visit Date: 04/11/2022  Procedures: Visit Diagnoses:  1. Arthritis of left knee     Large Joint Inj: L knee on 04/11/2022 1:58 PM Indications: pain, joint swelling and diagnostic evaluation Details: 18 G 1.5 in needle, superolateral approach  Arthrogram: No  Medications: 5 mL lidocaine 1 %; 60 mg Sodium Hyaluronate 60 MG/3ML Aspirate: 45 mL Outcome: tolerated well, no immediate complications  Patient has not had gel injection before.  Discussed gel injection in depth with him.  He was made aware to contact the office if he has any sort of reaction to gel injection.  Also discussed his history of CMC arthritis and he does have intermittent thumb pain and wrist pain.  Recommended that if he would like to try [facility] injections, make an appointment on his way out or in the near future for bilateral CMC injections under ultrasound guidance. Procedure, treatment alternatives, risks and benefits explained, specific risks discussed. Consent was given by the patient. Immediately prior to procedure a time out was called to verify the correct patient, procedure, equipment, support staff and site/side marked as required. Patient was prepped and draped in the usual sterile fashion.

## 2022-06-27 ENCOUNTER — Other Ambulatory Visit: Payer: Self-pay | Admitting: *Deleted

## 2022-06-27 DIAGNOSIS — I712 Thoracic aortic aneurysm, without rupture, unspecified: Secondary | ICD-10-CM

## 2022-08-02 ENCOUNTER — Other Ambulatory Visit: Payer: Self-pay

## 2022-08-02 ENCOUNTER — Encounter: Payer: Self-pay | Admitting: *Deleted

## 2022-08-02 ENCOUNTER — Ambulatory Visit
Admission: RE | Admit: 2022-08-02 | Discharge: 2022-08-02 | Disposition: A | Discharge status: Home / Self Care | Payer: Medicare Other | Source: Ambulatory Visit | Attending: Cardiology | Admitting: Cardiology

## 2022-08-02 DIAGNOSIS — C8580 Other specified types of non-Hodgkin lymphoma, unspecified site: Secondary | ICD-10-CM

## 2022-08-02 DIAGNOSIS — I712 Thoracic aortic aneurysm, without rupture, unspecified: Secondary | ICD-10-CM

## 2022-08-02 MED ORDER — IOPAMIDOL (ISOVUE-370) INJECTION 76%
75.0000 mL | Freq: Once | INTRAVENOUS | Status: AC | PRN
  Administered 2022-08-02: 75 mL via INTRAVENOUS

## 2022-08-03 ENCOUNTER — Other Ambulatory Visit: Payer: Self-pay

## 2022-08-03 ENCOUNTER — Inpatient Hospital Stay: Payer: Medicare Other | Attending: Hematology | Admitting: Hematology

## 2022-08-03 ENCOUNTER — Inpatient Hospital Stay: Payer: Medicare Other

## 2022-08-03 VITALS — BP 144/73 | HR 66 | Temp 97.9°F | Resp 16 | Ht 68.0 in | Wt 248.1 lb

## 2022-08-03 DIAGNOSIS — C8580 Other specified types of non-Hodgkin lymphoma, unspecified site: Secondary | ICD-10-CM | POA: Diagnosis not present

## 2022-08-03 DIAGNOSIS — Z7901 Long term (current) use of anticoagulants: Secondary | ICD-10-CM | POA: Insufficient documentation

## 2022-08-03 DIAGNOSIS — Z86711 Personal history of pulmonary embolism: Secondary | ICD-10-CM | POA: Diagnosis not present

## 2022-08-03 DIAGNOSIS — I7121 Aneurysm of the ascending aorta, without rupture: Secondary | ICD-10-CM | POA: Insufficient documentation

## 2022-08-03 DIAGNOSIS — Z8572 Personal history of non-Hodgkin lymphomas: Secondary | ICD-10-CM | POA: Diagnosis present

## 2022-08-03 NOTE — Progress Notes (Signed)
HEMATOLOGY/ONCOLOGY CLINIC NOTE  Date of Service: 08/03/2022  Patient Care Team: [name], MD as PCP - General (Internal Medicine) Stanford Breed Denice Bors, MD as PCP - Cardiology (Cardiology)  CHIEF COMPLAINTS/PURPOSE OF CONSULTATION:  Follow-up for continued evaluation and management of non-Hodgkin's lymphoma  HISTORY OF PRESENTING ILLNESS:  Kenneth [name] is a wonderful 72 y.o. male who has been referred to [place] by Dr Wyatt Portela for evaluation and management of lymphadenopathy. The pt reports that he is doing well overall.    The pt reports that he began to have some back discomfort the day after his CT scan, but has otherwise felt well over the last 6 months. When he stands up, lays down, or walks he does not experience the pain. When he sits for a long period of time or slouches it begins to hurt. He denies any recent changes in his urine or urinary habits. He has previously had kidney stones. Pt also reports overall fatigue, but thinks that this is best explained by his advanced age. He has not had any recent infections. Pt has a cat that he sometimes sleeps in close quarters with. He received his COVID19 vaccine after his scan on 03/24, but prior to his scan on 04/29.   Pt does have some lower leg swelling in both legs which he attributes to weight gain. Pt has had leg swelling in the past during a Gout attack. Pt had PE in 02/20, for which no cause has been found, but there was concern that a blood clot from his lower extremities traveled to his lungs. He has continued using anti-coagulation therapy as prescribed. Pt had a localized Melanoma on his right shoulder that was removed 20 years ago. He was seen at [facility] and received a short series of injections. He last saw his Dermatologist three years ago and there were no worries of recurrence. Pt does have occasional lightheadedness, about once per week, that is improved after eating. About 3-4 years ago he was having episodes where he felt that  he had to focus on breathing because he was not breathing automatically. Pt has had an epididymal cyst for several years that has been unchanging.    Of note prior to the patient's visit today, pt has had CT Abd/Pel ([id number]) completed on 11/07/2019 with results revealing "Mild lymphadenopathy throughout the abdomen and pelvis, new since 2011 exam. Suggest correlation for clinical/laboratory evidence of lymphoproliferative disorder, and consider short-term follow-up by CT in 3 months. Stable small benign left adrenal adenoma. Left nephrolithiasis. No evidence of ureteral calculi or hydronephrosis."    Most recent lab results (10/18/2019) of CBC w/diff and CMP is as follows: all values are WNL except for RDW at 40.3, Glucose at 117.   On review of systems, pt reports lower leg swelling, lightheadedness, back pain, fatigue and denies dysuria, hematuria, discolored urine, polyuria, fevers, chills, night sweats, unexpected weight loss, loss of appetite, diarrhea, constipation, abdominal pain, testicular pain/swelling and any other symptoms.    On PMHx the pt reports PE, Palpitations, HTN, Hypercholesterolemia, Ascending Aortic Aneurysm, Gout, Melanoma. On Social Hx the pt reports that he does not smoke cigarettes and does not drink alcohol.    INTERVAL HISTORY: [name] is here for follow-up of his low-grade non-Hodgkin's lymphoma.  Patient was last seen by me on 01/31/2022 and he was doing well overall.   Patient reports he has been doing fairly well without any new medical concerns since our last visit. Patient notes he was on  Prednisone for one week. Patient complains of skin rash with bloody discharge near his pelvis area with mild pain, but denies blood in the urination.  Patient is now taking Eliquis twice a day instead of once a day. He notes that his blood pressure is stable.  Patient has an upcoming nose surgery.   He denies fever, chills, unexpected weight loss, abdominal pain,  testicular pain, abnormal bowel movement, back pain, chest pain, or leg swelling. However, he complains of night sweats when he was taking Prednisone.   Patient reports of mild depression due to winter and not staying in touch with family and friends. He notes that his depression symptoms decreases in summer. He denies any suicidal thoughts.     MEDICAL HISTORY:  Past Medical History:  Diagnosis Date   Ascending aortic aneurysm ([place]) 09/04/2018   Dyspnea 09/03/2018   Elevated troponin 09/03/2018   Gout    Hypercholesteremia    Hypertension    Nephrolithiasis    Palpitations 09/04/2018   PE (pulmonary thromboembolism) ([place]) 09/04/2018   Pulmonary embolism ([place])     SURGICAL HISTORY: Past Surgical History:  Procedure Laterality Date   No prior surgery      SOCIAL HISTORY: Social History   Socioeconomic History   Marital status: Married    Spouse name: Not on file   Number of children: Not on file   Years of education: Not on file   Highest education level: Not on file  Occupational History   Not on file  Tobacco Use   Smoking status: Never   Smokeless tobacco: Never  Substance and Sexual Activity   Alcohol use: Never   Drug use: Never   Sexual activity: Not on file  Other Topics Concern   Not on file  Social History Narrative   Not on file   Social Determinants of Health   Financial Resource Strain: Not on file  Food Insecurity: Not on file  Transportation Needs: Not on file  Physical Activity: Not on file  Stress: Not on file  Social Connections: Not on file  Intimate Partner Violence: Not on file    FAMILY HISTORY: Family History  Problem Relation Age of Onset   Diabetes Mother    Hypertension Mother    CAD Brother        CABG at age 39    ALLERGIES:  is allergic to hctz [hydrochlorothiazide].  MEDICATIONS:  Current Outpatient Medications  Medication Sig Dispense Refill   allopurinol (ZYLOPRIM) 100 MG tablet Take 100 mg by mouth 2 (two) times  daily.   3   apixaban (ELIQUIS) 5 MG TABS tablet Take 5 mg by mouth 2 (two) times daily.     calcium carbonate (TUMS - DOSED IN MG ELEMENTAL CALCIUM) 500 MG chewable tablet Chew 1 tablet by mouth at bedtime as needed for indigestion or heartburn.     carvedilol (COREG) 12.5 MG tablet TAKE 1 TABLET BY MOUTH TWICE DAILY WITH A MEAL 180 tablet 1   cloNIDine (CATAPRES) 0.3 MG tablet Take 0.3 mg by mouth daily.     finasteride (PROSCAR) 5 MG tablet Take 5 mg by mouth daily.  2   hydrALAZINE (APRESOLINE) 50 MG tablet Take 1 tablet (50 mg total) by mouth 2 (two) times daily. 180 tablet 3   losartan (COZAAR) 100 MG tablet Take 100 mg by mouth daily.  5   rosuvastatin (CRESTOR) 40 MG tablet Take 1 tablet (40 mg total) by mouth daily at 6 PM. 30 tablet 0  No current facility-administered medications for this visit.    REVIEW OF SYSTEMS:   .10 Point review of Systems was done is negative except as noted above.  PHYSICAL EXAMINATION: ECOG PERFORMANCE STATUS: 1 - Symptomatic but completely ambulatory  . Vitals:   08/03/22 1257  BP: (!) 144/73  Pulse: 66  Resp: 16  Temp: 97.9 F (36.6 C)  SpO2: 99%   Filed Weights   08/03/22 1257  Weight: 248 lb 1.6 oz (112.5 kg)  .Body mass index is 37.72 kg/m.  NAD GENERAL:alert, in no acute distress and comfortable SKIN: no acute rashes, no significant lesions EYES: conjunctiva are pink and non-injected, sclera anicteric NECK: supple, no JVD LYMPH:  no palpable lymphadenopathy in the cervical, axillary or inguinal regions LUNGS: clear to auscultation b/l with normal respiratory effort HEART: regular rate & rhythm ABDOMEN:  normoactive bowel sounds , non tender, not distended. Extremity: no pedal edema PSYCH: alert & oriented x 3 with fluent speech NEURO: no focal motor/sensory deficits  LABORATORY DATA:  I have reviewed the data as listed  .    Latest Ref Rng & Units 01/31/2022    2:31 PM 02/01/2021    1:47 PM 08/03/2020   11:27 AM  CBC   WBC 4.0 - 10.5 K/uL 6.2  5.9  5.7   Hemoglobin 13.0 - 17.0 g/dL 13.3  13.4  13.6   Hematocrit 39.0 - 52.0 % 38.8  40.1  41.4   Platelets 150 - 400 K/uL 228  214  215     .    Latest Ref Rng & Units 01/31/2022    2:31 PM 02/01/2021    1:47 PM 08/03/2020   11:27 AM  CMP  Glucose 70 - 99 mg/dL 97  105  107   BUN 8 - 23 mg/dL '10  7  9   '$ Creatinine 0.61 - 1.24 mg/dL 0.91  0.92  0.93   Sodium 135 - 145 mmol/L 138  139  140   Potassium 3.5 - 5.1 mmol/L 4.5  4.5  4.2   Chloride 98 - 111 mmol/L 107  105  105   CO2 22 - 32 mmol/L '27  25  27   '$ Calcium 8.9 - 10.3 mg/dL 9.1  9.5  9.0   Total Protein 6.5 - 8.1 g/dL 6.8  7.1  7.1   Total Bilirubin 0.3 - 1.2 mg/dL 0.6  0.8  0.9   Alkaline Phos 38 - 126 U/L 40  43  42   AST 15 - 41 U/L '19  20  18   '$ ALT 0 - 44 U/L '16  18  18    '$ . Lab Results  Component Value Date   LDH 155 01/31/2022    12/16/19 of Surgical Pathology [phone])  RADIOGRAPHIC STUDIES: I have personally reviewed the radiological images as listed and agreed with the findings in the report. CT ANGIO CHEST AORTA W &/OR WO CONTRAST  Result Date: 08/02/2022 CLINICAL DATA:  TAA, lymphoma. EXAM: CT ANGIOGRAPHY CHEST WITH CONTRAST TECHNIQUE: Multidetector CT imaging of the chest was performed using the standard protocol during bolus administration of intravenous contrast. Multiplanar CT image reconstructions and MIPs were obtained to evaluate the vascular anatomy. RADIATION DOSE REDUCTION: This exam was performed according to the departmental dose-optimization program which includes automated exposure control, adjustment of the mA and/or kV according to patient size and/or use of iterative reconstruction technique. CONTRAST:  75 mL ISOVUE-370 IOPAMIDOL (ISOVUE-370) INJECTION 76% COMPARISON:  None Available. FINDINGS: Cardiovascular: Ectatic ascending thoracic aorta  now measures 4.5 x 4.3 cm compared to 4.3 x 4.1 cm measured in the same location on the prior study. No dissection. No  filling defects in pulmonary arteries to indicate PE. No pericardial effusion or cardiomegaly. 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 Mediastinum/Nodes: No enlarged mediastinal, hilar, or axillary lymph nodes. Thyroid gland, trachea, and esophagus demonstrate no significant findings. Lungs/Pleura: Lungs are clear. No pleural effusion or pneumothorax. Upper Abdomen: No acute pathology. Upper abdominal mesenteric adenopathy partially resolved compared to the prior study. For example one dominant node that now measures 1.3 x 0.8 cm compared to 1.9 x 1.1 cm previously. Musculoskeletal: No chest wall abnormality. No acute or significant osseous findings. There are thoracic degenerative changes. Review of the MIP images confirms the above findings. IMPRESSION: 1. Ascending thoracic aortic aneurysm measures 4.5 x 4.3 cm compared to 4.3 x 4.1 cm previously. 2. Otherwise unremarkable examination of the chest. 3. Interval decrease in the partially imaged upper abdominal mesenteric adenopathy. Electronically Signed   By: Sammie Bench M.D.   On: 08/02/2022 09:57    CTA C/A/P 10/02/2019: IMPRESSION: 1. Stable aneurysmal dilatation of the ascending thoracic aorta. Recommend annual imaging followup by CTA or MRA. This recommendation follows 2010 ACCF/AHA/AATS/ACR/ASA/SCA/SCAI/SIR/STS/SVM Guidelines for the Diagnosis and Management of Patients with Thoracic Aortic Disease. Circulation. 2010; 121: K160-F093. Aortic aneurysm NOS (ICD10-I71.9) 2. Interval development of mediastinal and upper abdominal lymphadenopathy. Please correlate with any history of lymphoma or leukemia. Dedicated CT of the abdomen and pelvis may be useful to evaluate the extent of the abdominal adenopathy. 3. No evidence of pulmonary emboli on this exam. Resolution of the segmental lower lobe emboli seen previously.   These results will be called to the ordering clinician or representative by the Radiologist  Assistant, and communication documented in the PACS or [employer].     Electronically Signed   By: Randa Ngo M.D.   On: 10/02/2019 17:00    ASSESSMENT & PLAN:   72 yo with   1) Low grade Non Hodgkins lymphoma Presented with generalized lymphadenopathy in the mediastinum and throughout abdomen and pelvis-  Bx ssuggesting low grade NHL -- likely Marginal zone lymphoma vs FL  PLAN: -outside labs brought in by patient reviewdeed. Stable CBC and CMP -Patient has no lab or clinical evidence of progression of his Hodgkin's lymphoma at this time. -No urgent hematologic reason for repeat imaging at this time. -Discussed lab results in detail with the patient.  -Discussed CT Angio Chest results from 08/02/2022, which showed Ascending thoracic aortic aneurysm measures 4.5 x 4.3 cm compared to 4.3 x 4.1 cm previously. Otherwise unremarkable examination of the chest. Interval decrease in the partially imaged upper abdominal mesenteric adenopathy. -Advised the patient to follow up with PCP or Urologist if there is still bloody discharge from penis. -Answered all of patient's questions.   FOLLOW-UP: RTC with Dr Irene Limbo with labs in 6 months  The total time spent in the appointment was 30 minutes* .  All of the patient's questions were answered with apparent satisfaction. The patient knows to call the clinic with any problems, questions or concerns.   Sullivan Lone MD MS AAHIVMS [facility] Hematology/Oncology Physician [facility]  .*Total Encounter Time as defined by the Centers for Medicare and Medicaid Services includes, in addition to the face-to-face time of a patient visit (documented in the note above) non-face-to-face time: obtaining and reviewing outside history, ordering and reviewing medications, tests or procedures, care  coordination (communications with other health care professionals or caregivers) and documentation in the medical record.   I, Cleda Mccreedy, am  acting as a Education administrator for Sullivan Lone, MD. .I have reviewed the above documentation for accuracy and completeness, and I agree with the above. [name] MD

## 2022-08-05 ENCOUNTER — Encounter: Payer: Self-pay | Admitting: Hematology

## 2022-12-09 ENCOUNTER — Ambulatory Visit: Payer: Medicare Other | Admitting: Surgical

## 2022-12-09 ENCOUNTER — Telehealth: Payer: Self-pay

## 2022-12-09 DIAGNOSIS — M170 Bilateral primary osteoarthritis of knee: Secondary | ICD-10-CM

## 2022-12-09 DIAGNOSIS — M1711 Unilateral primary osteoarthritis, right knee: Secondary | ICD-10-CM | POA: Diagnosis not present

## 2022-12-09 DIAGNOSIS — M1712 Unilateral primary osteoarthritis, left knee: Secondary | ICD-10-CM

## 2022-12-09 NOTE — Telephone Encounter (Signed)
Auth needed for bilat knee gel injections  °

## 2022-12-10 ENCOUNTER — Encounter: Payer: Self-pay | Admitting: Surgical

## 2022-12-10 MED ORDER — BUPIVACAINE HCL 0.25 % IJ SOLN
4.0000 mL | INTRAMUSCULAR | Status: AC | PRN
  Administered 2022-12-09: 4 mL via INTRA_ARTICULAR

## 2022-12-10 MED ORDER — LIDOCAINE HCL 1 % IJ SOLN
5.0000 mL | INTRAMUSCULAR | Status: AC | PRN
  Administered 2022-12-09: 5 mL

## 2022-12-10 MED ORDER — METHYLPREDNISOLONE ACETATE 40 MG/ML IJ SUSP
40.0000 mg | INTRAMUSCULAR | Status: AC | PRN
  Administered 2022-12-09: 40 mg via INTRA_ARTICULAR

## 2022-12-10 NOTE — Progress Notes (Signed)
Office Visit Note   Patient: Kenneth [name]           Date of Birth: 06-22-1951           MRN: [id number] Visit Date: 12/09/2022 Requested by: Ralene Ok, MD [id number] [place],  [place] [address] PCP: Ralene Ok, MD  Subjective: Chief Complaint  Patient presents with   Left Knee - Pain   Right Knee - Pain    HPI: Kenneth [name] is a 72 y.o. male who presents to the office reporting bilateral knee pain.  Patient states that he has had left knee pain that has been ongoing for several years and he had previous gel injection in late 2023 that provided good relief for him.  He states that the right knee pain has been more of a recent thing, starting after he took a step in November.  Did not notice any swelling at the time.  Has been causing him pain at night in the right knee but no nighttime pain in the left knee.  No radiation of pain.  Localizes pain to the medial aspect of both knees.  No ecchymosis or bruising noted.  No groin pain.  No radicular pain.  He is very active with gardening..                ROS: All systems reviewed are negative as they relate to the chief complaint within the history of present illness.  Patient denies fevers or chills.  Assessment & Plan: Visit Diagnoses:  1. Bilateral primary osteoarthritis of knee     Plan: Patient is a 72 year old male who presents for evaluation of bilateral knee pain.  He has history of bilateral knee osteoarthritis based on left knee radiographs from April 2023 demonstrating severe left knee osteoarthritis primarily in the medial compartment with moderate right knee osteoarthritis that is worse in the medial compartment as well.  We discussed options available to patient and he would like to proceed with bilateral knee cortisone injections.  44 cc was aspirated from the left knee and 15 cc aspirated from the right knee prior to injections.  He tolerated procedures well.  No complication.  Since this right knee pain is more  of a newer pain, he will keep [place] posted on how this injection does for him for the right knee pain.  Not really having any mechanical symptoms but if this injection fails to help his pain significantly, we could consider either trying a gel injection or getting MRI to further evaluate what is causing worse pain in the right knee relative to the left.  Plan to preapproved for bilateral knee gel injection.  This patient is diagnosed with osteoarthritis of the knee(s).    Radiographs show evidence of joint space narrowing, osteophytes, subchondral sclerosis and/or subchondral cysts.  This patient has knee pain which interferes with functional and activities of daily living.    This patient has experienced inadequate response, adverse effects and/or intolerance with conservative treatments such as acetaminophen, NSAIDS, topical creams, physical therapy or regular exercise, knee bracing and/or weight loss.   This patient has experienced inadequate response or has a contraindication to intra articular steroid injections for at least 3 months.   This patient is not scheduled to have a total knee replacement within 6 months of starting treatment with viscosupplementation.   Follow-Up Instructions: No follow-ups on file.   Orders:  No orders of the defined types were placed in this encounter.  No orders of the  defined types were placed in this encounter.     Procedures: Large Joint Inj: bilateral knee on 12/09/2022 4:05 PM Indications: diagnostic evaluation, joint swelling and pain Details: 18 G 1.5 in needle, superolateral approach  Arthrogram: No  Medications (Right): 5 mL lidocaine 1 %; 4 mL bupivacaine 0.25 %; 40 mg methylPREDNISolone acetate 40 MG/ML Aspirate (Right): 15 mL Medications (Left): 5 mL lidocaine 1 %; 4 mL bupivacaine 0.25 %; 40 mg methylPREDNISolone acetate 40 MG/ML Aspirate (Left): 44 mL Outcome: tolerated well, no immediate complications Procedure, treatment alternatives,  risks and benefits explained, specific risks discussed. Consent was given by the patient. Immediately prior to procedure a time out was called to verify the correct patient, procedure, equipment, support staff and site/side marked as required. Patient was prepped and draped in the usual sterile fashion.       Clinical Data: No additional findings.  Objective: Vital Signs: There were no vitals taken for this visit.  Physical Exam:  Constitutional: Patient appears well-developed HEENT:  Head: Normocephalic Eyes:EOM are normal Neck: Normal range of motion Cardiovascular: Normal rate Pulmonary/chest: Effort normal Neurologic: Patient is alert Skin: Skin is warm Psychiatric: Patient has normal mood and affect  Ortho Exam: Ortho exam demonstrates left knee with moderate effusion, right knee with small effusion.  No cellulitis or skin changes noted.  No calf tenderness.  Negative Homans' sign.  No pain with hip range of motion bilaterally.  Able to perform straight leg raise without extensor lag.  Tenderness over the medial joint line moderately and mild tenderness over the lateral joint line bilaterally.  No laxity to anterior posterior drawer.  Specialty Comments:  No specialty comments available.  Imaging: No results found.   PMFS History: Patient Active Problem List   Diagnosis Date Noted   Ascending aortic aneurysm ([facility]) 09/04/2018   PE (pulmonary thromboembolism) ([facility]) 09/04/2018   Palpitations 09/04/2018   Hypertension    Hypertensive urgency 09/03/2018   Elevated troponin 09/03/2018   Dyspnea 09/03/2018   Past Medical History:  Diagnosis Date   Ascending aortic aneurysm ([facility]) 09/04/2018   Dyspnea 09/03/2018   Elevated troponin 09/03/2018   Gout    Hypercholesteremia    Hypertension    Nephrolithiasis    Palpitations 09/04/2018   PE (pulmonary thromboembolism) ([facility]) 09/04/2018   Pulmonary embolism ([facility])     Family History  Problem Relation Age of Onset   Diabetes  Mother    Hypertension Mother    CAD Brother        CABG at age 4    Past Surgical History:  Procedure Laterality Date   No prior surgery     Social History   Occupational History   Not on file  Tobacco Use   Smoking status: Never   Smokeless tobacco: Never  Substance and Sexual Activity   Alcohol use: Never   Drug use: Never   Sexual activity: Not on file

## 2022-12-12 NOTE — Telephone Encounter (Signed)
VOB submitted for Durolane, bilateral knee  

## 2023-02-01 ENCOUNTER — Other Ambulatory Visit: Payer: Medicare Other

## 2023-02-01 ENCOUNTER — Ambulatory Visit: Payer: Medicare Other | Admitting: Hematology

## 2023-02-10 ENCOUNTER — Other Ambulatory Visit (INDEPENDENT_AMBULATORY_CARE_PROVIDER_SITE_OTHER): Payer: Medicare Other

## 2023-02-10 ENCOUNTER — Ambulatory Visit: Payer: Medicare Other | Admitting: Orthopaedic Surgery

## 2023-02-10 DIAGNOSIS — M25552 Pain in left hip: Secondary | ICD-10-CM

## 2023-02-10 MED ORDER — METHOCARBAMOL 750 MG PO TABS: 750.0000 mg | ORAL_TABLET | Freq: Two times a day (BID) | ORAL | 2 refills | Status: AC | PRN

## 2023-02-10 MED ORDER — TRAMADOL HCL 50 MG PO TABS: 50.0000 mg | ORAL_TABLET | Freq: Two times a day (BID) | ORAL | 2 refills | Status: AC | PRN

## 2023-02-10 MED ORDER — METHYLPREDNISOLONE 4 MG PO TBPK: ORAL_TABLET | ORAL | 0 refills | Status: AC

## 2023-02-10 NOTE — Progress Notes (Signed)
Office Visit Note   Patient: Kenneth [name]           Date of Birth: Nov 20, 1950           MRN: [id number] Visit Date: 02/10/2023              Requested by: Ralene Ok, MD [id number] [place],  [place] [address] PCP: Ralene Ok, MD   Assessment & Plan: Visit Diagnoses:  1. Pain in left hip     Plan: Impression is left buttock pain likely from underlying multilevel degenerative disc disease with advanced changes L5-S1.  I do not feel symptoms are consistent with piriformis syndrome.  I would like to start him on a Medrol Dosepak, Robaxin and physical therapy.  Referral has been made.  We have discussed follow-up with Dr. Christell Constant if his symptoms return or do not improve.  Call us with concerns or questions in the meantime.  Follow-Up Instructions: Return if symptoms worsen or fail to improve.   Orders:  Orders Placed This Encounter  Procedures   XR HIP UNILAT W OR W/O PELVIS 2-3 VIEWS LEFT   XR Lumbar Spine 2-3 Views   Ambulatory referral to Physical Therapy   Meds ordered this encounter  Medications   methylPREDNISolone (MEDROL DOSEPAK) 4 MG TBPK tablet    Sig: Do not take nsaids while on this medication    Dispense:  21 tablet    Refill:  0   methocarbamol (ROBAXIN-750) 750 MG tablet    Sig: Take 1 tablet (750 mg total) by mouth 2 (two) times daily as needed for muscle spasms.    Dispense:  20 tablet    Refill:  2   traMADol (ULTRAM) 50 MG tablet    Sig: Take 1 tablet (50 mg total) by mouth every 12 (twelve) hours as needed.    Dispense:  30 tablet    Refill:  2      Procedures: No procedures performed   Clinical Data: No additional findings.   Subjective: Chief Complaint  Patient presents with   Left Hip - Pain    HPI patient is a pleasant 72 year old gentleman who comes in today with left buttock pain.  Symptoms began a couple months ago and worsened over the past 2 weeks.  He denies any injury or change in activity.  The pain is to the left buttock  without any radiation down the leg.  No back pain.  No pain into the anterior thigh or groin.  Symptoms are constant when he is not taking ibuprofen but does get relief with ibuprofen and more recently Norco.  He denies any weakness to the left lower extremity.  Symptoms are worse when he is sitting from a standing position as well as when he walks any distance like walking to his mailbox.  He does get relief at times when he is sitting or lying with his legs extended.  He also notes an occasional shocking sensation to the left foot.  Review of Systems as detailed in HPI.  All others reviewed and are negative.   Objective: Vital Signs: There were no vitals taken for this visit.  Physical Exam well-developed and well-nourished gentleman in no acute distress.  Alert and oriented x 3.  Ortho Exam lumbar spine exam: No spinous or paraspinous tenderness.  No pain with lumbar flexion or extension.  No pain with straight leg raise.  No focal weakness.  Does have tenderness to deep palpation over the sciatic notch.  No tenderness  to the piriformis.  He is neurovascularly intact distally.  Specialty Comments:  No specialty comments available.  Imaging: XR HIP UNILAT W OR W/O PELVIS 2-3 VIEWS LEFT  Result Date: 02/10/2023 No acute or structural abnormalities  XR Lumbar Spine 2-3 Views  Result Date: 02/10/2023 Moderate multilevel degenerative changes with advanced changes L5-S1    PMFS History: Patient Active Problem List   Diagnosis Date Noted   Ascending aortic aneurysm (HCC) 09/04/2018   PE (pulmonary thromboembolism) (HCC) 09/04/2018   Palpitations 09/04/2018   Hypertension    Hypertensive urgency 09/03/2018   Elevated troponin 09/03/2018   Dyspnea 09/03/2018   Past Medical History:  Diagnosis Date   Ascending aortic aneurysm (HCC) 09/04/2018   Dyspnea 09/03/2018   Elevated troponin 09/03/2018   Gout    Hypercholesteremia    Hypertension    Nephrolithiasis    Palpitations 09/04/2018    PE (pulmonary thromboembolism) (HCC) 09/04/2018   Pulmonary embolism (HCC)     Family History  Problem Relation Age of Onset   Diabetes Mother    Hypertension Mother    CAD Brother        CABG at age 44    Past Surgical History:  Procedure Laterality Date   No prior surgery     Social History   Occupational History   Not on file  Tobacco Use   Smoking status: Never   Smokeless tobacco: Never  Substance and Sexual Activity   Alcohol use: Never   Drug use: Never   Sexual activity: Not on file

## 2023-02-16 ENCOUNTER — Other Ambulatory Visit: Payer: Self-pay

## 2023-02-16 ENCOUNTER — Encounter: Payer: Self-pay | Admitting: Physical Therapy

## 2023-02-16 ENCOUNTER — Ambulatory Visit: Payer: Medicare Other | Admitting: Physical Therapy

## 2023-02-16 DIAGNOSIS — M6281 Muscle weakness (generalized): Secondary | ICD-10-CM

## 2023-02-16 DIAGNOSIS — M25552 Pain in left hip: Secondary | ICD-10-CM

## 2023-02-16 DIAGNOSIS — R29898 Other symptoms and signs involving the musculoskeletal system: Secondary | ICD-10-CM

## 2023-02-16 NOTE — Therapy (Addendum)
OUTPATIENT PHYSICAL THERAPY THORACOLUMBAR EVALUATION  / DISCHARGE   Patient Name: Kenneth [name] MRN: [id number] DOB:[birth year], 72 y.o., male Today's Date: 02/16/2023  END OF SESSION:  PT End of Session - 02/16/23 1354     Visit Number 1    Number of Visits 9    Date for PT Re-Evaluation 03/16/23    Authorization Type [employer] MCR    Authorization Time Period 02/16/23 to 03/16/23    Progress Note Due on Visit 10    PT Start Time 1345    PT Stop Time 1423   pt wanted to get MD advice on cortisone shot prior to continuing with PT   PT Time Calculation (min) 38 min    Activity Tolerance Patient tolerated treatment well    Behavior During Therapy [facility] for tasks assessed/performed;Anxious             Past Medical History:  Diagnosis Date   Ascending aortic aneurysm (HCC) 09/04/2018   Dyspnea 09/03/2018   Elevated troponin 09/03/2018   Gout    Hypercholesteremia    Hypertension    Nephrolithiasis    Palpitations 09/04/2018   PE (pulmonary thromboembolism) (HCC) 09/04/2018   Pulmonary embolism [facility])    Past Surgical History:  Procedure Laterality Date   No prior surgery     Patient Active Problem List   Diagnosis Date Noted   Ascending aortic aneurysm (HCC) 09/04/2018   PE (pulmonary thromboembolism) (HCC) 09/04/2018   Palpitations 09/04/2018   Hypertension    Hypertensive urgency 09/03/2018   Elevated troponin 09/03/2018   Dyspnea 09/03/2018    PCP: Ralene Ok MD   REFERRING PROVIDER: Cristie Hem, PA-C  REFERRING DIAG: [phone] (ICD-10-CM) - Pain in left hip  Rationale for Evaluation and Treatment: Rehabilitation  THERAPY DIAG:  Pain in left hip  Muscle weakness (generalized)  Other symptoms and signs involving the musculoskeletal system  ONSET DATE: 2.5 months ago   SUBJECTIVE:                                                                                                                                                                                            SUBJECTIVE STATEMENT: Woke up about 2.5 months ago with a twinge in my left hip and its been getting worse ever since. I've been taking a lot of ibuprofen and acetominophin from my PCP. I need to take about 2000-3000mg  of ibuprofen just to get out of bed, its an extraordinary pain. No radicular symptoms, its just in the hip. I've lost 10# in the last 3 weeks, no dietary changes but I haven't been able to do exercise or anything.  PERTINENT HISTORY:  Ascending aortic aneurysm, gout, HLD, HTN, palpitations, hx PE  PAIN:  Are you having pain? Yes: NPRS scale: "I feel it but I can walk upright without screaming" no number given/10 Pain location: deep in L hip Pain description: "it throws me down, I can't do anything" very sharp  Aggravating factors: medicine wearing off  Relieving factors: medicines   PRECAUTIONS: None  RED FLAGS: None   WEIGHT BEARING RESTRICTIONS: No  FALLS:  Has patient fallen in last 6 months? No  LIVING ENVIRONMENT: Lives with: lives alone Lives in: House/apartment Stairs: "stairs up and down everywhere"  Has following equipment at home: Dan Humphreys - 2 wheeled  OCCUPATION: retired   PLOF: Independent, Independent with basic ADLs, Independent with gait, and Independent with transfers  PATIENT GOALS: figure out what's causing pain and get rid of it, get MRI if PT doesn't help   NEXT MD VISIT: referring PRN   OBJECTIVE:   DIAGNOSTIC FINDINGS:  Moderate multilevel degenerative changes with advanced changes L5-S1  No acute or structural abnormalities  PATIENT SURVEYS:  FOTO 17, predicted 53 in 16 visits     COGNITION: Overall cognitive status: Within functional limits for tasks assessed       MUSCLE LENGTH:  Quads severe limitation  HS mild limitation B  Piriformis mild limitation B   LLD- R LE about 1/4 inch longer     PALPATION:  Not tender to palpation in low back or L hip, tells me "its deeper than that"   LUMBAR ROM:    AROM eval  Flexion WNL; RFIS no change   Extension WNL, REIS no change   Right lateral flexion WNL   Left lateral flexion WNL   Right rotation   Left rotation    (Blank rows = not tested)    LOWER EXTREMITY MMT:    MMT Right eval Left eval  Hip flexion 5 4+  Hip extension 4- 4-  Hip abduction 5 4+  Hip adduction    Hip internal rotation    Hip external rotation    Knee flexion 5 4+  Knee extension 5 4+  Ankle dorsiflexion 5 5  Ankle plantarflexion    Ankle inversion    Ankle eversion     (Blank rows = not tested)    TODAY'S TREATMENT:                                                                                                                              DATE:   Eval  Objective findings, care planning and appropriate education (see below)  SI MET with  100% correction of LLD    PATIENT EDUCATION:  Education details: exam findings, POC, HEP, see clinical impression statement for specific details otherwise- extensive education given today  Person educated: Patient Education method: Explanation Education comprehension: verbalized understanding and needs further education  HOME EXERCISE PROGRAM: TBD   ASSESSMENT:  CLINICAL IMPRESSION: Patient is a 72 y.o. M  who was seen today for physical therapy evaluation and treatment for L hip pain. He does emphasize to me that without high doses of ibuprofen and acetominphin "I can't even get out of bed without screaming or do anything without extreme pain, if I take my medicines I'm OK". Does endorse some rapid weight loss, 10# over the past 3 weeks. Exam with objective findings as above. I do think that he has some SI joint involvement given his symptoms and objective findings this afternoon.  Spent quite a bit of time educating on anatomy of the SI region as well as relation to pain and symptoms, as well as PT scope and ability to address pain and function using SI MET, targeted functional strengthening and  flexibility  to address his concerns and pain levels. He did perseverate on interventions such as MRI and receiving cortisone shot from Dr. Roda Shutters prior to PT, also perseverated on pain medicine dosages and timing. Per pt, "I don't want to make you mad but I don't see how PT will help since I've been sweeping and squatting and exercising, don't we need to focus on things like the MRI and cortisone shot?". We spent quite a bit of time discussing PT POC and patient autonomy in determining/participating in POC moving forward. Agreed that PT will reach out to referring MD regarding possible cortisone shot prior to initiating PT treatment sessions, will also plan to reach out to MD regarding pt report of rapid weight loss without a significant reason in the context of cancer hx as well.   OBJECTIVE IMPAIRMENTS: difficulty walking, decreased strength, increased fascial restrictions, impaired flexibility, improper body mechanics, postural dysfunction, and pain.   ACTIVITY LIMITATIONS: standing, squatting, sleeping, stairs, transfers, bed mobility, and locomotion level  PARTICIPATION LIMITATIONS: cleaning, laundry, driving, shopping, community activity, occupation, and yard work  PERSONAL FACTORS: Age, Behavior pattern, Education, Fitness, Past/current experiences, Sex, Social background, and Time since onset of injury/illness/exacerbation are also affecting patient's functional outcome.   REHAB POTENTIAL: Fair    CLINICAL DECISION MAKING: Stable/uncomplicated  EVALUATION COMPLEXITY: Low   GOALS: Goals reviewed with patient? No  SHORT TERM GOALS: Target date: 03/16/2023    Will be compliant with appropriate progressive HEP  Baseline: Goal status: INITIAL  2.  Will report at least a 25% improvement in pain intensity in the mornings without taking pain medicines  Baseline:  Goal status: INITIAL  3.  Quad flexibility to be no more than moderately limited B  Baseline:  Goal status: INITIAL  4.  Will  be able to perform all daily tasks with at least a 25% improvement in pain without taking pain medicines  Baseline:  Goal status: INITIAL  5.  FOTO score to have improved by at least 10 points  Baseline:  Goal status: INITIAL    LONG TERM GOALS: Target date: not appropriate at time of eval, will update if patient is agreeable to extend POC     PLAN:  PT FREQUENCY: 1-2x/week  PT DURATION: 4 weeks  PLANNED INTERVENTIONS: Therapeutic exercises, Therapeutic activity, Gait training, Patient/Family education, Self Care, Joint mobilization, Joint manipulation, Aquatic Therapy, Dry Needling, Electrical stimulation, Spinal mobilization, Cryotherapy, Moist heat, Taping, Ultrasound, Ionotophoresis 4mg /ml Dexamethasone, Manual therapy, and Re-evaluation.  PLAN FOR NEXT SESSION: does he want to continue with PT? If so check SI and perform MET PRN, otherwise LE strength and flexibility, core strengthening. Still needs HEP   Nedra Hai, PT, DPT 02/16/23 3:28 PM   PHYSICAL THERAPY DISCHARGE SUMMARY  Visits from [facility]  of Care: 1  Current functional level related to goals / functional outcomes: See note   Remaining deficits: See note   Education / Equipment: HEP  Patient goals were not met. Patient is being discharged due to not returning since the last visit.  Chyrel Masson, PT, DPT, OCS, ATC 04/06/23  2:05 PM

## 2023-02-17 ENCOUNTER — Other Ambulatory Visit: Payer: Self-pay

## 2023-02-17 DIAGNOSIS — M25552 Pain in left hip: Secondary | ICD-10-CM

## 2023-02-17 DIAGNOSIS — M545 Low back pain, unspecified: Secondary | ICD-10-CM

## 2023-02-21 ENCOUNTER — Other Ambulatory Visit: Payer: Self-pay | Admitting: Physical Medicine and Rehabilitation

## 2023-02-21 ENCOUNTER — Ambulatory Visit: Payer: Medicare Other | Admitting: Physical Medicine and Rehabilitation

## 2023-02-21 ENCOUNTER — Encounter: Payer: Self-pay | Admitting: Physical Medicine and Rehabilitation

## 2023-02-21 DIAGNOSIS — M47816 Spondylosis without myelopathy or radiculopathy, lumbar region: Secondary | ICD-10-CM

## 2023-02-21 DIAGNOSIS — M5416 Radiculopathy, lumbar region: Secondary | ICD-10-CM

## 2023-02-21 DIAGNOSIS — M7918 Myalgia, other site: Secondary | ICD-10-CM

## 2023-02-21 MED ORDER — HYDROCODONE-ACETAMINOPHEN 5-325 MG PO TABS: 1.0000 | ORAL_TABLET | Freq: Three times a day (TID) | ORAL | 0 refills | Status: DC | PRN

## 2023-02-21 NOTE — Progress Notes (Signed)
Kenneth [name] - 72 y.o. male MRN [id number]  Date of birth: 05/29/1951  Office Visit Note: Visit Date: 02/21/2023 PCP: Ralene Ok, MD Referred by: Ralene Ok, MD  Subjective: Chief Complaint  Patient presents with   Left Hip - Pain   HPI: Kenneth [name] is a 72 y.o. male who comes in today per the request of Tessa Lerner, [place] for evaluation of chronic, worsening and severe left sided buttock pain. Intermittent shocking sensation to left foot. Pain ongoing for several months, worsens with movement and activity. He describes pain as sharp stabbing sensation, currently rates as 8 out of 10. Some relief of pain with rest and use of medications. Some relief of pain with Ibuprofen. He recently attended first session of formal physical therapy with minimal relief pain. Recent lumbar x-rays exhibit possible spondylolisthesis at L5-S1. Severe facet arthropathy and biforaminal narrowing also noted at this level. Patient was recently evaluated by Dr. Glee Arvin and Mardella Layman from orthopedic standpoint, recent bilateral hip rays show no acute or structural abnormalities. Patient states he is sure that his pain is coming from his hip and is confused as to why we are seeing him. He is frustrated that he has seen several providers and does not have answer for his pain. States he needs treatment quickly. Patient denies focal weakness. No recent trauma or falls.    Oswestry Disability Index Score 36% 10 to 20 (40%) moderate disability: The patient experiences more pain and difficulty with sitting, lifting and standing. Travel and social life are more difficult, and they may be disabled from work. Personal care, sexual activity and sleeping are not grossly affected, and the patient can usually be managed by conservative means.  Review of Systems  Musculoskeletal:  Positive for myalgias.  Neurological:  Positive for tingling. Negative for speech change and focal weakness.  All other systems reviewed  and are negative.  Otherwise per HPI.  Assessment & Plan: Visit Diagnoses:    ICD-10-CM   1. Lumbar radiculopathy  M54.16     2. Buttock pain  M79.18     3. Facet arthropathy, lumbar  M47.816        Plan: Findings:  Chronic, worsening and severe left-sided buttock pain.  No real radiation of pain down left leg.  Patient continues to have severe pain despite good conservative therapy such as formal physical therapy, home exercise regimen, rest and use of medications.  Patient's clinical presentation and exam are complex, differentials include lumbar radiculopathy versus facet mediated pain.  I discussed recent lumbar x-ray with patient today using images and spine model.  There is severe facet arthropathy noted at L5-S1.  No pain noted upon internal and external rotation of left hip.  I agree with Dr. Roda Shutters and Mardella Layman as I do not think this is an intrinsic hip issue.  No history of groin pain.  I discussed treatment plan with patient in detail today and explained to him that his symptoms could be referred from his lumbar spine.    Next step is to obtain a lumbar MRI imaging.  I explained to him that this is somewhat of a process as we do have to get MRI approved with his insurance and will need to get him scheduled with [facility].  I also refilled short course of Norco to take for more moderate/severe pain.  Patient inquired about severe pain, he is concerned that his pain will not allow him to walk and perform duties at home.  He wants  to know what he can do about this severe pain.  I informed him he can take the Norco at home, if he feels his pain is very severe he can always go to the emergency room for evaluation.  I will have patient follow-up for lumbar MRI review, depending on results of MRI imaging we would consider performing lumbar injection.  No red flag symptoms noted upon exam today.    Meds & Orders: No orders of the defined types were placed in this encounter.  No orders of  the defined types were placed in this encounter.   Follow-up: Return for lumbar MRI review.   Procedures: No procedures performed      Clinical History: No specialty comments available.   He reports that he has never smoked. He has never used smokeless tobacco. No results for input(s): "HGBA1C", "LABURIC" in the last 8760 hours.  Objective:  VS:  HT:    WT:   BMI:     BP:   HR: bpm  TEMP: ( )  RESP:  Physical Exam Vitals and nursing note reviewed.  HENT:     Head: Normocephalic and atraumatic.     Right Ear: External ear normal.     Left Ear: External ear normal.     Nose: Nose normal.     Mouth/Throat:     Mouth: Mucous membranes are moist.  Eyes:     Extraocular Movements: Extraocular movements intact.  Cardiovascular:     Rate and Rhythm: Normal rate.     Pulses: Normal pulses.  Pulmonary:     Effort: Pulmonary effort is normal.  Abdominal:     General: Abdomen is flat. There is no distension.  Musculoskeletal:        General: Tenderness present.     Cervical back: Normal range of motion.     Comments: Patient rises from seated position to standing without difficulty. Good lumbar range of motion. No pain noted with facet loading. 5/5 strength noted with bilateral hip flexion, knee flexion/extension, ankle dorsiflexion/plantarflexion and EHL. No clonus noted bilaterally. No pain upon palpation of greater trochanters. No pain with internal/external rotation of bilateral hips. Sensation intact bilaterally. Negative slump test bilaterally. Ambulates without aid, gait steady.     Skin:    General: Skin is warm and dry.     Capillary Refill: Capillary refill takes less than 2 seconds.  Neurological:     General: No focal deficit present.     Mental Status: He is alert and oriented to person, place, and time.     Ortho Exam  Imaging: No results found.  Past Medical/Family/Surgical/Social History: Medications & Allergies reviewed per EMR, new medications  updated. Patient Active Problem List   Diagnosis Date Noted   Ascending aortic aneurysm (HCC) 09/04/2018   PE (pulmonary thromboembolism) (HCC) 09/04/2018   Palpitations 09/04/2018   Hypertension    Hypertensive urgency 09/03/2018   Elevated troponin 09/03/2018   Dyspnea 09/03/2018   Past Medical History:  Diagnosis Date   Ascending aortic aneurysm (HCC) 09/04/2018   Dyspnea 09/03/2018   Elevated troponin 09/03/2018   Gout    Hypercholesteremia    Hypertension    Nephrolithiasis    Palpitations 09/04/2018   PE (pulmonary thromboembolism) (HCC) 09/04/2018   Pulmonary embolism (HCC)    Family History  Problem Relation Age of Onset   Diabetes Mother    Hypertension Mother    CAD Brother        CABG at age 39   Past  Surgical History:  Procedure Laterality Date   No prior surgery     Social History   Occupational History   Not on file  Tobacco Use   Smoking status: Never   Smokeless tobacco: Never  Substance and Sexual Activity   Alcohol use: Never   Drug use: Never   Sexual activity: Not on file

## 2023-02-21 NOTE — Progress Notes (Signed)
Functional Pain Scale - descriptive words and definitions  Moderate (4)   Constantly aware of pain, can complete ADLs with modification/sleep marginally affected at times/passive distraction is of no use, but active distraction gives some relief. Moderate range order  Average Pain  varies on activity  Left hip pain. Sitting and lying down makes pain go away

## 2023-02-22 ENCOUNTER — Other Ambulatory Visit: Payer: Self-pay

## 2023-02-22 ENCOUNTER — Telehealth: Payer: Self-pay | Admitting: Physical Medicine and Rehabilitation

## 2023-02-22 DIAGNOSIS — C8580 Other specified types of non-Hodgkin lymphoma, unspecified site: Secondary | ICD-10-CM

## 2023-02-22 MED ORDER — HYDROCODONE-ACETAMINOPHEN 5-325 MG PO TABS: 1.0000 | ORAL_TABLET | Freq: Three times a day (TID) | ORAL | 0 refills | Status: DC | PRN

## 2023-02-22 NOTE — Telephone Encounter (Signed)
Patient called. Says the pharmacy does not have the RX. His cb# [phone]

## 2023-02-23 ENCOUNTER — Other Ambulatory Visit: Payer: Self-pay

## 2023-02-23 ENCOUNTER — Inpatient Hospital Stay: Payer: Medicare Other | Attending: Hematology

## 2023-02-23 ENCOUNTER — Inpatient Hospital Stay: Payer: Medicare Other | Admitting: Hematology

## 2023-02-23 VITALS — BP 149/70 | HR 69 | Temp 98.3°F | Resp 20 | Wt 246.4 lb

## 2023-02-23 DIAGNOSIS — Z8572 Personal history of non-Hodgkin lymphomas: Secondary | ICD-10-CM | POA: Diagnosis present

## 2023-02-23 DIAGNOSIS — Z7901 Long term (current) use of anticoagulants: Secondary | ICD-10-CM | POA: Diagnosis not present

## 2023-02-23 DIAGNOSIS — C8580 Other specified types of non-Hodgkin lymphoma, unspecified site: Secondary | ICD-10-CM

## 2023-02-23 DIAGNOSIS — Z86711 Personal history of pulmonary embolism: Secondary | ICD-10-CM | POA: Insufficient documentation

## 2023-02-23 DIAGNOSIS — Z79899 Other long term (current) drug therapy: Secondary | ICD-10-CM | POA: Insufficient documentation

## 2023-02-23 LAB — CBC WITH DIFFERENTIAL (CANCER CENTER ONLY)
Abs Immature Granulocytes: 0.02 10*3/uL (ref 0.00–0.07)
Basophils Absolute: 0.1 10*3/uL (ref 0.0–0.1)
Basophils Relative: 1 %
Eosinophils Absolute: 0.4 10*3/uL (ref 0.0–0.5)
Eosinophils Relative: 7 %
HCT: 39.6 % (ref 39.0–52.0)
Hemoglobin: 13.3 g/dL (ref 13.0–17.0)
Immature Granulocytes: 0 %
Lymphocytes Relative: 34 %
Lymphs Abs: 1.8 10*3/uL (ref 0.7–4.0)
MCH: 28.1 pg (ref 26.0–34.0)
MCHC: 33.6 g/dL (ref 30.0–36.0)
MCV: 83.7 fL (ref 80.0–100.0)
Monocytes Absolute: 0.5 10*3/uL (ref 0.1–1.0)
Monocytes Relative: 9 %
Neutro Abs: 2.6 10*3/uL (ref 1.7–7.7)
Neutrophils Relative %: 49 %
Platelet Count: 243 10*3/uL (ref 150–400)
RBC: 4.73 MIL/uL (ref 4.22–5.81)
RDW: 13.7 % (ref 11.5–15.5)
WBC Count: 5.4 10*3/uL (ref 4.0–10.5)
nRBC: 0 % (ref 0.0–0.2)

## 2023-02-23 LAB — CMP (CANCER CENTER ONLY)
ALT: 21 U/L (ref 0–44)
AST: 16 U/L (ref 15–41)
Albumin: 3.8 g/dL (ref 3.5–5.0)
Alkaline Phosphatase: 39 U/L (ref 38–126)
Anion gap: 9 (ref 5–15)
BUN: 9 mg/dL (ref 8–23)
CO2: 21 mmol/L — ABNORMAL LOW (ref 22–32)
Calcium: 8.4 mg/dL — ABNORMAL LOW (ref 8.9–10.3)
Chloride: 107 mmol/L (ref 98–111)
Creatinine: 0.91 mg/dL (ref 0.61–1.24)
GFR, Estimated: >60 mL/min (ref >60)
Glucose, Bld: 224 mg/dL — ABNORMAL HIGH (ref 70–99)
Potassium: 3.9 mmol/L (ref 3.5–5.1)
Sodium: 137 mmol/L (ref 135–145)
Total Bilirubin: 0.8 mg/dL (ref 0.3–1.2)
Total Protein: 6.1 g/dL — ABNORMAL LOW (ref 6.5–8.1)

## 2023-02-23 LAB — LACTATE DEHYDROGENASE: LDH: 136 U/L (ref 98–192)

## 2023-02-23 NOTE — Progress Notes (Signed)
HEMATOLOGY/ONCOLOGY CLINIC NOTE  Date of Service: 02/23/2023  Patient Care Team: Ralene Ok, MD as PCP - General (Internal Medicine) Jens Som Madolyn Frieze, MD as PCP - Cardiology (Cardiology)  CHIEF COMPLAINTS/PURPOSE OF CONSULTATION:  Follow-up for continued evaluation and management of non-Hodgkin's lymphoma  HISTORY OF PRESENTING ILLNESS:  Kenneth [name] is a wonderful 72 y.o. male who has been referred to [place] by Dr Dewaine Conger for evaluation and management of lymphadenopathy. The pt reports that he is doing well overall.    The pt reports that he began to have some back discomfort the day after his CT scan, but has otherwise felt well over the last 6 months. When he stands up, lays down, or walks he does not experience the pain. When he sits for a long period of time or slouches it begins to hurt. He denies any recent changes in his urine or urinary habits. He has previously had kidney stones. Pt also reports overall fatigue, but thinks that this is best explained by his advanced age. He has not had any recent infections. Pt has a cat that he sometimes sleeps in close quarters with. He received his COVID19 vaccine after his scan on 03/24, but prior to his scan on 04/29.   Pt does have some lower leg swelling in both legs which he attributes to weight gain. Pt has had leg swelling in the past during a Gout attack. Pt had PE in 02/20, for which no cause has been found, but there was concern that a blood clot from his lower extremities traveled to his lungs. He has continued using anti-coagulation therapy as prescribed. Pt had a localized Melanoma on his right shoulder that was removed 20 years ago. He was seen at [facility] and received a short series of injections. He last saw his Dermatologist three years ago and there were no worries of recurrence. Pt does have occasional lightheadedness, about once per week, that is improved after eating. About 3-4 years ago he was having episodes where he felt that  he had to focus on breathing because he was not breathing automatically. Pt has had an epididymal cyst for several years that has been unchanging.    Of note prior to the patient's visit today, pt has had CT Abd/Pel ([id number]) completed on 11/07/2019 with results revealing "Mild lymphadenopathy throughout the abdomen and pelvis, new since 2011 exam. Suggest correlation for clinical/laboratory evidence of lymphoproliferative disorder, and consider short-term follow-up by CT in 3 months. Stable small benign left adrenal adenoma. Left nephrolithiasis. No evidence of ureteral calculi or hydronephrosis."    Most recent lab results (10/18/2019) of CBC w/diff and CMP is as follows: all values are WNL except for RDW at 40.3, Glucose at 117.   On review of systems, pt reports lower leg swelling, lightheadedness, back pain, fatigue and denies dysuria, hematuria, discolored urine, polyuria, fevers, chills, night sweats, unexpected weight loss, loss of appetite, diarrhea, constipation, abdominal pain, testicular pain/swelling and any other symptoms.    On PMHx the pt reports PE, Palpitations, HTN, Hypercholesterolemia, Ascending Aortic Aneurysm, Gout, Melanoma. On Social Hx the pt reports that he does not smoke cigarettes and does not drink alcohol.    INTERVAL HISTORY: Kenneth [name] is here for follow-up of his low-grade non-Hodgkin's lymphoma.  Patient was last seen by me on 08/03/2022 and he complained of skin rash with bloody discharge near his pelvis area, mild depression due to winter but denies any suicidal thoughts, and night sweats when taking Prednisone.  Patient notes he has been doing well overall since our last visit. He complains of bilateral  gluteal pain, which started around 2 months ago and has gradually worsened, mainly left  gluteal. He has been to his orthopedic physician and has had an lumbar spine and hip xray. Patient notes he is going to have an MRI soon. He is currently taking  tramadol, muscle relaxer, hydrocodone with acetaminophen 1-2 day, and ibuprofen. Patient notes he has to take pain medication in order to walk.   Patient notes he is in "screaming pain" and throbbing pain for the past 3 weeks. He notes his pain is better when he lays down and worsens when he gets up. He recently had colonoscopy which did not show any abnormalities.   He denies any new infection issues, new lumps/bumps, chills, fever, unexpected weight loss, back pain, inconsistency, numbness near scrotal area, scrotal pain, scrotal swelling, testicular pain, chest pain, or leg swelling. He does complain of night sweats around 2-3 nights a month. He also notes that his bowel movement changed in January for around 1-2 months. He notes his bowel movement is back to normal during this visit.    MEDICAL HISTORY:  Past Medical History:  Diagnosis Date   Ascending aortic aneurysm (HCC) 09/04/2018   Dyspnea 09/03/2018   Elevated troponin 09/03/2018   Gout    Hypercholesteremia    Hypertension    Nephrolithiasis    Palpitations 09/04/2018   PE (pulmonary thromboembolism) (HCC) 09/04/2018   Pulmonary embolism (HCC)     SURGICAL HISTORY: Past Surgical History:  Procedure Laterality Date   No prior surgery      SOCIAL HISTORY: Social History   Socioeconomic History   Marital status: Married    Spouse name: Not on file   Number of children: Not on file   Years of education: Not on file   Highest education level: Not on file  Occupational History   Not on file  Tobacco Use   Smoking status: Never   Smokeless tobacco: Never  Substance and Sexual Activity   Alcohol use: Never   Drug use: Never   Sexual activity: Not on file  Other Topics Concern   Not on file  Social History Narrative   Not on file   Social Determinants of Health   Financial Resource Strain: Not on file  Food Insecurity: Not on file  Transportation Needs: Not on file  Physical Activity: Not on file  Stress: Not on  file  Social Connections: Not on file  Intimate Partner Violence: Not on file    FAMILY HISTORY: Family History  Problem Relation Age of Onset   Diabetes Mother    Hypertension Mother    CAD Brother        CABG at age 39    ALLERGIES:  is allergic to hctz [hydrochlorothiazide].  MEDICATIONS:  Current Outpatient Medications  Medication Sig Dispense Refill   allopurinol (ZYLOPRIM) 100 MG tablet Take 100 mg by mouth 2 (two) times daily.   3   apixaban (ELIQUIS) 5 MG TABS tablet Take 5 mg by mouth 2 (two) times daily.     calcium carbonate (TUMS - DOSED IN MG ELEMENTAL CALCIUM) 500 MG chewable tablet Chew 1 tablet by mouth at bedtime as needed for indigestion or heartburn.     carvedilol (COREG) 12.5 MG tablet TAKE 1 TABLET BY MOUTH TWICE DAILY WITH A MEAL 180 tablet 1   cloNIDine (CATAPRES) 0.3 MG tablet Take 0.3 mg by mouth daily.  eplerenone (INSPRA) 25 MG tablet Take 25 mg by mouth daily.     finasteride (PROSCAR) 5 MG tablet Take 5 mg by mouth daily.  2   hydrALAZINE (APRESOLINE) 50 MG tablet Take 1 tablet (50 mg total) by mouth 2 (two) times daily. 180 tablet 3   HYDROcodone-acetaminophen (NORCO/VICODIN) 5-325 MG tablet Take 1 tablet by mouth every 8 (eight) hours as needed for up to 5 days for moderate pain or severe pain. 20 tablet 0   losartan (COZAAR) 100 MG tablet Take 100 mg by mouth daily.  5   methocarbamol (ROBAXIN-750) 750 MG tablet Take 1 tablet (750 mg total) by mouth 2 (two) times daily as needed for muscle spasms. 20 tablet 2   methylPREDNISolone (MEDROL DOSEPAK) 4 MG TBPK tablet Do not take nsaids while on this medication 21 tablet 0   rosuvastatin (CRESTOR) 40 MG tablet Take 1 tablet (40 mg total) by mouth daily at 6 PM. 30 tablet 0   traMADol (ULTRAM) 50 MG tablet Take 1 tablet (50 mg total) by mouth every 12 (twelve) hours as needed. 30 tablet 2   No current facility-administered medications for this visit.    REVIEW OF SYSTEMS:   .10 Point review of  Systems was done is negative except as noted above.  PHYSICAL EXAMINATION: ECOG PERFORMANCE STATUS: 1 - Symptomatic but completely ambulatory  . Vitals:   02/23/23 1121  BP: (!) 149/70  Pulse: 69  Resp: 20  Temp: 98.3 F (36.8 C)  SpO2: 98%   Filed Weights   02/23/23 1121  Weight: 246 lb 6.4 oz (111.8 kg)   .Body mass index is 37.46 kg/m.  NAD GENERAL:alert, in no acute distress and comfortable SKIN: no acute rashes, no significant lesions EYES: conjunctiva are pink and non-injected, sclera anicteric NECK: supple, no JVD LYMPH:  no palpable lymphadenopathy in the cervical, axillary or inguinal regions LUNGS: clear to auscultation b/l with normal respiratory effort HEART: regular rate & rhythm ABDOMEN:  normoactive bowel sounds , non tender, not distended. Extremity: no pedal edema PSYCH: alert & oriented x 3 with fluent speech NEURO: no focal motor/sensory deficits  LABORATORY DATA:  I have reviewed the data as listed  .    Latest Ref Rng & Units 02/23/2023   10:43 AM 01/31/2022    2:31 PM 02/01/2021    1:47 PM  CBC  WBC 4.0 - 10.5 K/uL 5.4  6.2  5.9   Hemoglobin 13.0 - 17.0 g/dL 54.0  98.1  19.1   Hematocrit 39.0 - 52.0 % 39.6  38.8  40.1   Platelets 150 - 400 K/uL 243  228  214     .    Latest Ref Rng & Units 02/23/2023   10:43 AM 01/31/2022    2:31 PM 02/01/2021    1:47 PM  CMP  Glucose 70 - 99 mg/dL 478  97  295   BUN 8 - 23 mg/dL 9  10  7    Creatinine 0.61 - 1.24 mg/dL 6.21  3.08  6.57   Sodium 135 - 145 mmol/L 137  138  139   Potassium 3.5 - 5.1 mmol/L 3.9  4.5  4.5   Chloride 98 - 111 mmol/L 107  107  105   CO2 22 - 32 mmol/L 21  27  25    Calcium 8.9 - 10.3 mg/dL 8.4  9.1  9.5   Total Protein 6.5 - 8.1 g/dL 6.1  6.8  7.1   Total Bilirubin 0.3 - 1.2 mg/dL  0.8  0.6  0.8   Alkaline Phos 38 - 126 U/L 39  40  43   AST 15 - 41 U/L 16  19  20    ALT 0 - 44 U/L 21  16  18     . Lab Results  Component Value Date   LDH 136 02/23/2023    12/16/19 of  Surgical Pathology [phone])  RADIOGRAPHIC STUDIES: I have personally reviewed the radiological images as listed and agreed with the findings in the report. XR HIP UNILAT W OR W/O PELVIS 2-3 VIEWS LEFT  Result Date: 02/10/2023 No acute or structural abnormalities  XR Lumbar Spine 2-3 Views  Result Date: 02/10/2023 Moderate multilevel degenerative changes with advanced changes L5-S1   CTA C/A/P 10/02/2019: IMPRESSION: 1. Stable aneurysmal dilatation of the ascending thoracic aorta. Recommend annual imaging followup by CTA or MRA. This recommendation follows 2010 ACCF/AHA/AATS/ACR/ASA/SCA/SCAI/SIR/STS/SVM Guidelines for the Diagnosis and Management of Patients with Thoracic Aortic Disease. Circulation. 2010; 121: W119-J478. Aortic aneurysm NOS (ICD10-I71.9) 2. Interval development of mediastinal and upper abdominal lymphadenopathy. Please correlate with any history of lymphoma or leukemia. Dedicated CT of the abdomen and pelvis may be useful to evaluate the extent of the abdominal adenopathy. 3. No evidence of pulmonary emboli on this exam. Resolution of the segmental lower lobe emboli seen previously.   These results will be called to the ordering clinician or representative by the Radiologist Assistant, and communication documented in the PACS or [employer].     Electronically Signed   By: Sharlet Salina M.D.   On: 10/02/2019 17:00    ASSESSMENT & PLAN:   72 yo with   1) Low grade Non Hodgkins lymphoma Presented with generalized lymphadenopathy in the mediastinum and throughout abdomen and pelvis-  Bx ssuggesting low grade NHL -- likely Marginal zone lymphoma vs FL  PLAN: -Discussed lab results from today, 02/23/2023, with the patient. CBC is stable. CMP shows elevated glucose level at 224, but stable overall.  -Discussed the option of CT of abdomen pelvis. Patient agrees.  -Schedule patient for CT abdomen pelvis.  -Discussed with the patient to avoid  ibuprofen as it can cause ulcers. -recommend to continue to follow-up with orthopedic physician.  -Patient has no lab or clinical evidence of progression of his Hodgkin's lymphoma at this time. -No urgent hematologic reason for repeat imaging at this time. -Answered all of patient's questions.    FOLLOW-UP: -CT abd/pelvis in 5-7 days -severe pain in the lower back and left>rt gluteal area -Phone visit with Dr Candise Che in 2 weeks   The total time spent in the appointment was 30 minutes* .  All of the patient's questions were answered with apparent satisfaction. The patient knows to call the clinic with any problems, questions or concerns.   Wyvonnia Lora MD MS AAHIVMS [facility] Hematology/Oncology Physician [facility]  .*Total Encounter Time as defined by the Centers for Medicare and Medicaid Services includes, in addition to the face-to-face time of a patient visit (documented in the note above) non-face-to-face time: obtaining and reviewing outside history, ordering and reviewing medications, tests or procedures, care coordination (communications with other health care professionals or caregivers) and documentation in the medical record.   I,Param Shah,acting as a Neurosurgeon for Wyvonnia Lora, MD.,have documented all relevant documentation on the behalf of Wyvonnia Lora, MD,as directed by  Wyvonnia Lora, MD while in the presence of Wyvonnia Lora, MD.  .I have reviewed the above documentation for accuracy and completeness, and I agree with the  above. .Johney Maine MD

## 2023-02-24 ENCOUNTER — Telehealth: Payer: Self-pay | Admitting: Orthopaedic Surgery

## 2023-02-24 NOTE — Telephone Encounter (Signed)
Pt came in stating he has his imaging appts set up they are in his chart his pain meds need to be called into the pharmacy on [address] [place], Pt would like to know if there is a stronger pain med than hydrocodone what Megan prescribed will run out next week Pt would also like to know if he can set up a nurse visit next week to ask a few questions he wanted to ask in person if possible in afternoon.

## 2023-02-27 ENCOUNTER — Other Ambulatory Visit: Payer: Self-pay | Admitting: Physical Medicine and Rehabilitation

## 2023-02-27 MED ORDER — HYDROCODONE-ACETAMINOPHEN 5-325 MG PO TABS: 1.0000 | ORAL_TABLET | Freq: Three times a day (TID) | ORAL | 0 refills | Status: AC | PRN

## 2023-02-27 NOTE — Telephone Encounter (Signed)
LVM to return call to give him the information below

## 2023-03-02 ENCOUNTER — Ambulatory Visit (HOSPITAL_COMMUNITY)
Admission: RE | Admit: 2023-03-02 | Discharge: 2023-03-02 | Disposition: A | Discharge status: Home / Self Care | Payer: Medicare Other | Source: Ambulatory Visit | Attending: Hematology | Admitting: Hematology

## 2023-03-02 DIAGNOSIS — C8580 Other specified types of non-Hodgkin lymphoma, unspecified site: Secondary | ICD-10-CM | POA: Diagnosis present

## 2023-03-02 MED ORDER — IOHEXOL 300 MG/ML  SOLN
100.0000 mL | Freq: Once | INTRAMUSCULAR | Status: AC | PRN
  Administered 2023-03-02: 100 mL via INTRAVENOUS

## 2023-03-02 MED ORDER — IOHEXOL 9 MG/ML PO SOLN
ORAL | Status: AC
  Filled 2023-03-02: qty 1000

## 2023-03-02 MED ORDER — IOHEXOL 9 MG/ML PO SOLN
1000.0000 mL | ORAL | Status: AC
  Administered 2023-03-02: 1000 mL via ORAL

## 2023-03-08 ENCOUNTER — Telehealth: Payer: Self-pay | Admitting: Physical Medicine and Rehabilitation

## 2023-03-08 ENCOUNTER — Ambulatory Visit: Payer: Medicare Other | Admitting: Surgical

## 2023-03-08 ENCOUNTER — Other Ambulatory Visit: Payer: Self-pay | Admitting: Physical Medicine and Rehabilitation

## 2023-03-08 DIAGNOSIS — M170 Bilateral primary osteoarthritis of knee: Secondary | ICD-10-CM | POA: Diagnosis not present

## 2023-03-08 DIAGNOSIS — M1712 Unilateral primary osteoarthritis, left knee: Secondary | ICD-10-CM

## 2023-03-08 MED ORDER — OXYCODONE-ACETAMINOPHEN 5-325 MG PO TABS: 1.0000 | ORAL_TABLET | Freq: Three times a day (TID) | ORAL | 0 refills | Status: DC | PRN

## 2023-03-08 NOTE — Telephone Encounter (Signed)
Pt came in requesting stronger meds pa megan prescribed hydro med not working. Please call pt about this matter sometime today. Pt phone #[phone]

## 2023-03-08 NOTE — Telephone Encounter (Signed)
Patient returned call to [facility] asked for a call back. The number to contact patient is [phone]

## 2023-03-09 ENCOUNTER — Encounter: Payer: Self-pay | Admitting: Surgical

## 2023-03-09 MED ORDER — BUPIVACAINE HCL 0.25 % IJ SOLN
4.0000 mL | INTRAMUSCULAR | Status: AC | PRN
Start: 2023-03-08 — End: 2023-03-08
  Administered 2023-03-08: 4 mL via INTRA_ARTICULAR

## 2023-03-09 MED ORDER — LIDOCAINE HCL 1 % IJ SOLN
5.0000 mL | INTRAMUSCULAR | Status: AC | PRN
Start: 2023-03-08 — End: 2023-03-08
  Administered 2023-03-08: 5 mL

## 2023-03-09 MED ORDER — METHYLPREDNISOLONE ACETATE 40 MG/ML IJ SUSP
40.0000 mg | INTRAMUSCULAR | Status: AC | PRN
Start: 2023-03-08 — End: 2023-03-08
  Administered 2023-03-08: 40 mg via INTRA_ARTICULAR

## 2023-03-09 NOTE — Progress Notes (Signed)
Office Visit Note   Patient: Kenneth [name]           Date of Birth: 1951-03-17           MRN: [id number] Visit Date: 03/08/2023 Requested by: Ralene Ok, MD [id number] [place],  [place] [address] PCP: Ralene Ok, MD  Subjective: Chief Complaint  Patient presents with   Left Knee - Pain    HPI: Kenneth [name] is a 72 y.o. male who presents to the office reporting left knee pain.  Patient states that he has had return of left knee pain since last injection which was about 3 months ago.  He also has return of swelling.  No new injury.  No groin pain.  No radicular pain.  Primarily pain in the lateral aspect of the knee though pain is diffuse for the most part.  No fevers or chills.  He is ambulatory with a walker..                ROS: All systems reviewed are negative as they relate to the chief complaint within the history of present illness.  Patient denies fevers or chills.  Assessment & Plan: Visit Diagnoses: No diagnosis found.  Plan: Patient is a 72 year old male who presents for evaluation of left knee pain.  Has history of left knee osteoarthritis.  Would like to have cortisone injection today which has historically helped his left knee pain.  Right knee is still feeling well from the cortisone injection done 3 months ago.  Left knee was aspirated of 30 cc of inflammatory but nonpurulent synovial fluid.  Cortisone injection administered and patient tolerated procedure well without complication.  Follow-up with the office as needed.  Follow-Up Instructions: No follow-ups on file.   Orders:  No orders of the defined types were placed in this encounter.  No orders of the defined types were placed in this encounter.     Procedures: Large Joint Inj: L knee on 03/08/2023 1:24 PM Indications: diagnostic evaluation, joint swelling and pain Details: 18 G 1.5 in needle, superolateral approach  Arthrogram: No  Medications: 5 mL lidocaine 1 %; 40 mg methylPREDNISolone  acetate 40 MG/ML; 4 mL bupivacaine 0.25 % Outcome: tolerated well, no immediate complications Procedure, treatment alternatives, risks and benefits explained, specific risks discussed. Consent was given by the patient. Immediately prior to procedure a time out was called to verify the correct patient, procedure, equipment, support staff and site/side marked as required. Patient was prepped and draped in the usual sterile fashion.       Clinical Data: No additional findings.  Objective: Vital Signs: There were no vitals taken for this visit.  Physical Exam:  Constitutional: Patient appears well-developed HEENT:  Head: Normocephalic Eyes:EOM are normal Neck: Normal range of motion Cardiovascular: Normal rate Pulmonary/chest: Effort normal Neurologic: Patient is alert Skin: Skin is warm Psychiatric: Patient has normal mood and affect  Ortho Exam: Ortho exam demonstrates left knee with no cellulitis.  No skin changes.  Moderate effusion noted.  Able to perform straight leg raise.  Has range of motion from 3 degrees extension to greater than 90 degrees of knee flexion.  No calf tenderness.  Negative Homans' sign.  Palpable DP pulse.  Specialty Comments:  No specialty comments available.  Imaging: No results found.   PMFS History: Patient Active Problem List   Diagnosis Date Noted   Ascending aortic aneurysm (HCC) 09/04/2018   PE (pulmonary thromboembolism) (HCC) 09/04/2018   Palpitations 09/04/2018   Hypertension  Hypertensive urgency 09/03/2018   Elevated troponin 09/03/2018   Dyspnea 09/03/2018   Past Medical History:  Diagnosis Date   Ascending aortic aneurysm (HCC) 09/04/2018   Dyspnea 09/03/2018   Elevated troponin 09/03/2018   Gout    Hypercholesteremia    Hypertension    Nephrolithiasis    Palpitations 09/04/2018   PE (pulmonary thromboembolism) (HCC) 09/04/2018   Pulmonary embolism (HCC)     Family History  Problem Relation Age of Onset   Diabetes Mother     Hypertension Mother    CAD Brother        CABG at age 8    Past Surgical History:  Procedure Laterality Date   No prior surgery     Social History   Occupational History   Not on file  Tobacco Use   Smoking status: Never   Smokeless tobacco: Never  Substance and Sexual Activity   Alcohol use: Never   Drug use: Never   Sexual activity: Not on file

## 2023-03-14 ENCOUNTER — Inpatient Hospital Stay: Payer: Medicare Other | Attending: Hematology | Admitting: Hematology

## 2023-03-14 DIAGNOSIS — C8580 Other specified types of non-Hodgkin lymphoma, unspecified site: Secondary | ICD-10-CM | POA: Diagnosis not present

## 2023-03-14 NOTE — Progress Notes (Addendum)
HEMATOLOGY/ONCOLOGY TELE-MED VISIT NOTE  Date of Service: 03/14/2023  Patient Care Team: Ralene Ok, MD as PCP - General (Internal Medicine) Jens Som Madolyn Frieze, MD as PCP - Cardiology (Cardiology)  CHIEF COMPLAINTS/PURPOSE OF CONSULTATION:  Follow-up for continued evaluation and management of non-Hodgkin's lymphoma  HISTORY OF PRESENTING ILLNESS:  Kenneth [name] is a wonderful 72 y.o. male who has been referred to [place] by Dr Dewaine Conger for evaluation and management of lymphadenopathy. The pt reports that he is doing well overall.    The pt reports that he began to have some back discomfort the day after his CT scan, but has otherwise felt well over the last 6 months. When he stands up, lays down, or walks he does not experience the pain. When he sits for a long period of time or slouches it begins to hurt. He denies any recent changes in his urine or urinary habits. He has previously had kidney stones. Pt also reports overall fatigue, but thinks that this is best explained by his advanced age. He has not had any recent infections. Pt has a cat that he sometimes sleeps in close quarters with. He received his COVID19 vaccine after his scan on 03/24, but prior to his scan on 04/29.   Pt does have some lower leg swelling in both legs which he attributes to weight gain. Pt has had leg swelling in the past during a Gout attack. Pt had PE in 02/20, for which no cause has been found, but there was concern that a blood clot from his lower extremities traveled to his lungs. He has continued using anti-coagulation therapy as prescribed. Pt had a localized Melanoma on his right shoulder that was removed 20 years ago. He was seen at [facility] and received a short series of injections. He last saw his Dermatologist three years ago and there were no worries of recurrence. Pt does have occasional lightheadedness, about once per week, that is improved after eating. About 3-4 years ago he was having episodes where he  felt that he had to focus on breathing because he was not breathing automatically. Pt has had an epididymal cyst for several years that has been unchanging.    Of note prior to the patient's visit today, pt has had CT Abd/Pel ([id number]) completed on 11/07/2019 with results revealing "Mild lymphadenopathy throughout the abdomen and pelvis, new since 2011 exam. Suggest correlation for clinical/laboratory evidence of lymphoproliferative disorder, and consider short-term follow-up by CT in 3 months. Stable small benign left adrenal adenoma. Left nephrolithiasis. No evidence of ureteral calculi or hydronephrosis."    Most recent lab results (10/18/2019) of CBC w/diff and CMP is as follows: all values are WNL except for RDW at 40.3, Glucose at 117.   On review of systems, pt reports lower leg swelling, lightheadedness, back pain, fatigue and denies dysuria, hematuria, discolored urine, polyuria, fevers, chills, night sweats, unexpected weight loss, loss of appetite, diarrhea, constipation, abdominal pain, testicular pain/swelling and any other symptoms.    On PMHx the pt reports PE, Palpitations, HTN, Hypercholesterolemia, Ascending Aortic Aneurysm, Gout, Melanoma. On Social Hx the pt reports that he does not smoke cigarettes and does not drink alcohol.    INTERVAL HISTORY: Kenneth [name] is contacted via phone for follow-up of his low-grade non-Hodgkin's lymphoma.  .I connected with Kenneth [name] on 03/14/2023 at  3:30 PM EDT by telephone visit and verified that I am speaking with the correct person using two identifiers.   Patient was last seen  by me on 02/23/2023 and he complained of worsened bilateral gluteal pain and night sweats.   Patient notes he has been doing well overall since out last visit. He complains of consistent bilateral gluteal pain and lower back pain. He denies any new infection issues, bone pain, chest pain, fever, chills, night sweats, or leg swelling.   Patient is  scheduled for Lumbar Spine MRI on 03/20/2023.  I discussed the limitations, risks, security and privacy concerns of performing an evaluation and management service by telemedicine and the availability of in-person appointments. I also discussed with the patient that there may be a patient responsible charge related to this service. The patient expressed understanding and agreed to proceed.   Other persons participating in the visit and their role in the encounter: None   Patient's location: Home  Provider's location: [facility]   Chief Complaint: CT scan results.     MEDICAL HISTORY:  Past Medical History:  Diagnosis Date   Ascending aortic aneurysm (HCC) 09/04/2018   Dyspnea 09/03/2018   Elevated troponin 09/03/2018   Gout    Hypercholesteremia    Hypertension    Nephrolithiasis    Palpitations 09/04/2018   PE (pulmonary thromboembolism) (HCC) 09/04/2018   Pulmonary embolism (HCC)     SURGICAL HISTORY: Past Surgical History:  Procedure Laterality Date   No prior surgery      SOCIAL HISTORY: Social History   Socioeconomic History   Marital status: Married    Spouse name: Not on file   Number of children: Not on file   Years of education: Not on file   Highest education level: Not on file  Occupational History   Not on file  Tobacco Use   Smoking status: Never   Smokeless tobacco: Never  Substance and Sexual Activity   Alcohol use: Never   Drug use: Never   Sexual activity: Not on file  Other Topics Concern   Not on file  Social History Narrative   Not on file   Social Determinants of Health   Financial Resource Strain: Not on file  Food Insecurity: Not on file  Transportation Needs: Not on file  Physical Activity: Not on file  Stress: Not on file  Social Connections: Not on file  Intimate Partner Violence: Not on file    FAMILY HISTORY: Family History  Problem Relation Age of Onset   Diabetes Mother    Hypertension Mother    CAD Brother        CABG at age  23    ALLERGIES:  is allergic to hctz [hydrochlorothiazide].  MEDICATIONS:  Current Outpatient Medications  Medication Sig Dispense Refill   allopurinol (ZYLOPRIM) 100 MG tablet Take 100 mg by mouth 2 (two) times daily.   3   apixaban (ELIQUIS) 5 MG TABS tablet Take 5 mg by mouth 2 (two) times daily.     calcium carbonate (TUMS - DOSED IN MG ELEMENTAL CALCIUM) 500 MG chewable tablet Chew 1 tablet by mouth at bedtime as needed for indigestion or heartburn.     carvedilol (COREG) 12.5 MG tablet TAKE 1 TABLET BY MOUTH TWICE DAILY WITH A MEAL 180 tablet 1   cloNIDine (CATAPRES) 0.3 MG tablet Take 0.3 mg by mouth daily.     eplerenone (INSPRA) 25 MG tablet Take 25 mg by mouth daily.     finasteride (PROSCAR) 5 MG tablet Take 5 mg by mouth daily.  2   hydrALAZINE (APRESOLINE) 50 MG tablet Take 1 tablet (50 mg total) by mouth 2 (  two) times daily. 180 tablet 3   losartan (COZAAR) 100 MG tablet Take 100 mg by mouth daily.  5   methocarbamol (ROBAXIN-750) 750 MG tablet Take 1 tablet (750 mg total) by mouth 2 (two) times daily as needed for muscle spasms. 20 tablet 2   methylPREDNISolone (MEDROL DOSEPAK) 4 MG TBPK tablet Do not take nsaids while on this medication 21 tablet 0   oxyCODONE-acetaminophen (PERCOCET/ROXICET) 5-325 MG tablet Take 1 tablet by mouth every 8 (eight) hours as needed for severe pain. 20 tablet 0   rosuvastatin (CRESTOR) 40 MG tablet Take 1 tablet (40 mg total) by mouth daily at 6 PM. 30 tablet 0   traMADol (ULTRAM) 50 MG tablet Take 1 tablet (50 mg total) by mouth every 12 (twelve) hours as needed. 30 tablet 2   No current facility-administered medications for this visit.    REVIEW OF SYSTEMS:   .10 Point review of Systems was done is negative except as noted above.  PHYSICAL EXAMINATION: TELE-MED VISIT  LABORATORY DATA:  I have reviewed the data as listed  .    Latest Ref Rng & Units 02/23/2023   10:43 AM 01/31/2022    2:31 PM 02/01/2021    1:47 PM  CBC  WBC 4.0 -  10.5 K/uL 5.4  6.2  5.9   Hemoglobin 13.0 - 17.0 g/dL 60.1  09.3  23.5   Hematocrit 39.0 - 52.0 % 39.6  38.8  40.1   Platelets 150 - 400 K/uL 243  228  214     .    Latest Ref Rng & Units 02/23/2023   10:43 AM 01/31/2022    2:31 PM 02/01/2021    1:47 PM  CMP  Glucose 70 - 99 mg/dL 573  97  220   BUN 8 - 23 mg/dL 9  10  7    Creatinine 0.61 - 1.24 mg/dL 2.54  2.70  6.23   Sodium 135 - 145 mmol/L 137  138  139   Potassium 3.5 - 5.1 mmol/L 3.9  4.5  4.5   Chloride 98 - 111 mmol/L 107  107  105   CO2 22 - 32 mmol/L 21  27  25    Calcium 8.9 - 10.3 mg/dL 8.4  9.1  9.5   Total Protein 6.5 - 8.1 g/dL 6.1  6.8  7.1   Total Bilirubin 0.3 - 1.2 mg/dL 0.8  0.6  0.8   Alkaline Phos 38 - 126 U/L 39  40  43   AST 15 - 41 U/L 16  19  20    ALT 0 - 44 U/L 21  16  18     . Lab Results  Component Value Date   LDH 136 02/23/2023    12/16/19 of Surgical Pathology [phone])  RADIOGRAPHIC STUDIES: I have personally reviewed the radiological images as listed and agreed with the findings in the report. CT ABDOMEN PELVIS W CONTRAST  Result Date: 03/10/2023 CLINICAL DATA:  Marginal zone lymphoma, new lower back and left gluteal pain EXAM: CT ABDOMEN AND PELVIS WITH CONTRAST TECHNIQUE: Multidetector CT imaging of the abdomen and pelvis was performed using the standard protocol following bolus administration of intravenous contrast. RADIATION DOSE REDUCTION: This exam was performed according to the departmental dose-optimization program which includes automated exposure control, adjustment of the mA and/or kV according to patient size and/or use of iterative reconstruction technique. CONTRAST:  OMNIPAQUE IOHEXOL 300 MG/ML  SOLN COMPARISON:  07/20/2020 FINDINGS: Lower chest: Lung bases are clear. Hepatobiliary: Liver is  within normal limits. Gallbladder is unremarkable. No intrahepatic or extrahepatic duct dilatation. Pancreas: Within normal limits. Spleen: Normal in size, without focal lesion.  Adrenals/Urinary Tract: Stable 18 mm low-density left adrenal nodule, previously characterized as a benign adrenal adenoma. Right adrenal gland is within normal limits. 2.9 cm lateral interpolar left renal cyst (series 3/image 39), measuring fluid density, benign (Bosniak I). No follow-up is recommended. Two nonobstructing right renal calculi measuring up to 3 mm. Four nonobstructing left renal calculi measuring up to 6 mm in the lower pole (series 3/image 45). No ureteral or bladder calculi. No hydronephrosis. Bladder is within normal limits. Stomach/Bowel: Stomach is within normal limits. No evidence of bowel obstruction. Normal appendix (series 3/image 58). Mild left colonic diverticulosis, without evidence of diverticulitis. Vascular/Lymphatic: No evidence of abdominal aortic aneurysm. Atherosclerotic calcifications of the abdominal aorta and branch vessels. 18 mm short axis celiac axis node (series 3/image 24), new. Additional 12 mm short axis gastrohepatic and portacaval nodes (series 3/images 22 and 25), mildly improved, previously 15 and 14 mm respectively. 11 mm short axis left para-aortic node (series 3/image 29), previously 10 mm. 9 mm short axis aortocaval node (series 3/image 83), unchanged. Mild digital mesenteric stranding with scattered small nodes which are not pathologically enlarged, unchanged. Reproductive: Prostate is unremarkable. Other: No abdominopelvic ascites. Musculoskeletal: Degenerative changes of the visualized thoracolumbar spine. IMPRESSION: Upper abdominal and retroperitoneal lymphadenopathy, waxing/waning as described above, considered overall grossly unchanged. Stable 18 mm left adrenal adenoma, benign. No dedicated follow-up is recommended. Bilateral nonobstructing renal calculi. No hydronephrosis. Aortic Atherosclerosis (ICD10-I70.0). Electronically Signed   By: Charline Bills M.D.   On: 03/10/2023 00:03    CTA C/A/P 10/02/2019: IMPRESSION: 1. Stable aneurysmal dilatation of  the ascending thoracic aorta. Recommend annual imaging followup by CTA or MRA. This recommendation follows 2010 ACCF/AHA/AATS/ACR/ASA/SCA/SCAI/SIR/STS/SVM Guidelines for the Diagnosis and Management of Patients with Thoracic Aortic Disease. Circulation. 2010; 121: X528-U132. Aortic aneurysm NOS (ICD10-I71.9) 2. Interval development of mediastinal and upper abdominal lymphadenopathy. Please correlate with any history of lymphoma or leukemia. Dedicated CT of the abdomen and pelvis may be useful to evaluate the extent of the abdominal adenopathy. 3. No evidence of pulmonary emboli on this exam. Resolution of the segmental lower lobe emboli seen previously.   These results will be called to the ordering clinician or representative by the Radiologist Assistant, and communication documented in the PACS or [employer].     Electronically Signed   By: Sharlet Salina M.D.   On: 10/02/2019 17:00    ASSESSMENT & PLAN:   72 yo with   1) Low grade Non Hodgkins lymphoma Presented with generalized lymphadenopathy in the mediastinum and throughout abdomen and pelvis-  Bx ssuggesting low grade NHL -- likely Marginal zone lymphoma vs FL  PLAN: -Patient has no lab or clinical evidence of progression of his Hodgkin's lymphoma at this time. -No urgent hematologic reason for repeat imaging at this time. -Answered all of patient's questions.   -Discussed CT Abdomen Pelvis results from 03/02/2023 with the patient. Showed Upper abdominal and  retroperitoneal lymphadenopathy, waxing/waning as described above, considered overall grossly unchanged. Stable 18 mm left adrenal adenoma, benign. Bilateral non-obstructing renal calculi. Shows kidney stones.  -Continue to follow-up with orthopedic regarding lower back pain and MRI results after 03/20/2023.   FOLLOW-UP: Return to clinic with Dr. Candise Che with labs in 12 months  The total time spent in the appointment was 20 minutes* .  All of the patient's  questions were answered with apparent satisfaction.  The patient knows to call the clinic with any problems, questions or concerns.   Wyvonnia Lora MD MS AAHIVMS [facility] Hematology/Oncology [facility]  .*Total Encounter Time as defined by the Centers for Medicare and Medicaid Services includes, in addition to the face-to-face time of a patient visit (documented in the note above) non-face-to-face time: obtaining and reviewing outside history, ordering and reviewing medications, tests or procedures, care coordination (communications with other health care professionals or caregivers) and documentation in the medical record.   I,Param Shah,acting as a Neurosurgeon for Wyvonnia Lora, MD.,have documented all relevant documentation on the behalf of Wyvonnia Lora, MD,as directed by  Wyvonnia Lora, MD while in the presence of Wyvonnia Lora, MD.   .I have reviewed the above documentation for accuracy and completeness, and I agree with the above. Johney Maine MD

## 2023-03-20 ENCOUNTER — Ambulatory Visit
Admission: RE | Admit: 2023-03-20 | Discharge: 2023-03-20 | Disposition: A | Discharge status: Home / Self Care | Payer: Medicare Other | Source: Ambulatory Visit | Attending: Physical Medicine and Rehabilitation | Admitting: Physical Medicine and Rehabilitation

## 2023-03-20 DIAGNOSIS — M7918 Myalgia, other site: Secondary | ICD-10-CM

## 2023-03-27 ENCOUNTER — Telehealth: Payer: Self-pay | Admitting: Physical Medicine and Rehabilitation

## 2023-03-27 DIAGNOSIS — M5416 Radiculopathy, lumbar region: Secondary | ICD-10-CM

## 2023-03-27 DIAGNOSIS — M48062 Spinal stenosis, lumbar region with neurogenic claudication: Secondary | ICD-10-CM

## 2023-03-27 NOTE — Telephone Encounter (Signed)
Spoke with patient this afternoon to discuss recent lumbar MRI imaging. There is severe spinal canal stenosis at L4-L5. We discussed lumbar epidural steroid injections, he would like to move forward with injection. He would also like referral to chronic pain management.

## 2023-03-28 ENCOUNTER — Telehealth: Payer: Self-pay | Admitting: Physical Medicine and Rehabilitation

## 2023-03-28 ENCOUNTER — Other Ambulatory Visit: Payer: Self-pay | Admitting: Physical Medicine and Rehabilitation

## 2023-03-28 MED ORDER — OXYCODONE-ACETAMINOPHEN 5-325 MG PO TABS: 1.0000 | ORAL_TABLET | Freq: Three times a day (TID) | ORAL | 0 refills | Status: AC | PRN

## 2023-03-28 NOTE — Telephone Encounter (Signed)
Patient states he spoke with [facility] yesterday and she was to send in a Rx for Oxycodone in to [address]. He states pharmacy doesn't have the rx, please send in.  Also, he does not remember the type of surgery that Aundra Millet discussed with him and he would like to know what that was again. Please call patient [phone]

## 2023-03-28 NOTE — Telephone Encounter (Signed)
IC, spoke with patient. Took a verbal auth. MRI report mailed to patient.

## 2023-03-28 NOTE — Telephone Encounter (Signed)
Patient called asked if he can get a copy of his MRI report mailed to him?  The number to contact patient is [phone]

## 2023-04-13 ENCOUNTER — Ambulatory Visit: Payer: Medicare Other | Admitting: Physical Medicine and Rehabilitation

## 2023-04-13 ENCOUNTER — Other Ambulatory Visit: Payer: Self-pay

## 2023-04-13 VITALS — BP 206/93 | HR 101

## 2023-04-13 DIAGNOSIS — M25552 Pain in left hip: Secondary | ICD-10-CM | POA: Diagnosis not present

## 2023-04-13 DIAGNOSIS — M7918 Myalgia, other site: Secondary | ICD-10-CM

## 2023-04-13 DIAGNOSIS — G894 Chronic pain syndrome: Secondary | ICD-10-CM

## 2023-04-13 DIAGNOSIS — M5442 Lumbago with sciatica, left side: Secondary | ICD-10-CM

## 2023-04-13 DIAGNOSIS — M5416 Radiculopathy, lumbar region: Secondary | ICD-10-CM | POA: Diagnosis not present

## 2023-04-13 DIAGNOSIS — G8929 Other chronic pain: Secondary | ICD-10-CM

## 2023-04-13 MED ORDER — METHYLPREDNISOLONE ACETATE 40 MG/ML IJ SUSP
40.0000 mg | Freq: Once | INTRAMUSCULAR | Status: AC
Start: 2023-04-13 — End: 2023-04-13
  Administered 2023-04-13: 40 mg

## 2023-04-13 NOTE — Patient Instructions (Signed)

## 2023-05-11 DIAGNOSIS — I82459 Acute embolism and thrombosis of unspecified peroneal vein: Secondary | ICD-10-CM | POA: Insufficient documentation

## 2023-05-11 NOTE — Procedures (Signed)
Lumbosacral Transforaminal Epidural Steroid Injection - Sub-Pedicular Approach with Fluoroscopic Guidance  Patient: Kenneth [name]      Date of Birth: 05/04/1951 MRN: [id number] PCP: Ralene Ok, MD      Visit Date: 04/13/2023   Universal Protocol:    Date/Time: 04/13/2023  Consent Given By: the patient  Position: PRONE  Additional Comments: Vital signs were monitored before and after the procedure. Patient was prepped and draped in the usual sterile fashion. The correct patient, procedure, and site was verified.   Injection Procedure Details:   Procedure diagnoses: Lumbar radiculopathy [M54.16]    Meds Administered:  Meds ordered this encounter  Medications   methylPREDNISolone acetate (DEPO-MEDROL) injection 40 mg    Laterality: Left  Location/Site: L4  Needle:5.0 in., 22 ga.  Short bevel or Quincke spinal needle  Needle Placement: Transforaminal  Findings:    -Comments: Excellent flow of contrast along the nerve, nerve root and into the epidural space.  Procedure Details: After squaring off the end-plates to get a true AP view, the C-arm was positioned so that an oblique view of the foramen as noted above was visualized. The target area is just inferior to the "nose of the scotty dog" or sub pedicular. The soft tissues overlying this structure were infiltrated with 2-3 ml. of 1% Lidocaine without Epinephrine.  The spinal needle was inserted toward the target using a "trajectory" view along the fluoroscope beam.  Under AP and lateral visualization, the needle was advanced so it did not puncture dura and was located close the 6 O'Clock position of the pedical in AP tracterory. Biplanar projections were used to confirm position. Aspiration was confirmed to be negative for CSF and/or blood. A 1-2 ml. volume of Isovue-250 was injected and flow of contrast was noted at each level. Radiographs were obtained for documentation purposes.   After attaining the desired flow  of contrast documented above, a 0.5 to 1.0 ml test dose of 0.25% Marcaine was injected into each respective transforaminal space.  The patient was observed for 90 seconds post injection.  After no sensory deficits were reported, and normal lower extremity motor function was noted,   the above injectate was administered so that equal amounts of the injectate were placed at each foramen (level) into the transforaminal epidural space.   Additional Comments:  No complications occurred Dressing: 2 x 2 sterile gauze and Band-Aid    Post-procedure details: Patient was observed during the procedure. Post-procedure instructions were reviewed.  Patient left the clinic in stable condition.

## 2023-05-11 NOTE — Progress Notes (Signed)
Kenneth [name] - 73 y.o. male MRN [id number]  Date of birth: Nov 06, 1950  Office Visit Note: Visit Date: 04/13/2023 PCP: Ralene Ok, MD Referred by: Ralene Ok, MD  Subjective: Chief Complaint  Patient presents with   Lower Back - Pain   HPI: Kenneth [name] is a 71 y.o. male who comes in today for evaluation and management very severe recalcitrant low back and left hip pain and leg pain and buttock pain particularly with standing and walking.  He has been seen most recently in our office by Ellin Goodie, FNP.  MRI was performed of the lumbar spine and I did review that with him and his wife I believe today with images and spine models.  He actually is doing much better today than when we seen him in the past.  In the past he has been very focused on the amount of pain and what medications he can take etc.  He has been taking some pain medication without much relief.  He actually apologized today for his actions and in the past as he was really just distraught with the amount of pain that he was having.  It is improved a little bit and he is actually doing better today.  He is using a walker for ambulation.  He has not noted focal weakness however.  No neurologic symptoms.  Again pain in the hip and buttock not really the groin but sometimes the groin.  Nothing really on the right side.  MRI does show severe stenosis of the lumbar spine at L4-5 which is multifactorial.  We discussed at length today about surgical referral and the fact that he may need this decompressed at some point.   I spent more than 30 minutes speaking face-to-face with the patient with 50% of the time in counseling and discussing coordination of care.       Review of Systems  Musculoskeletal:  Positive for back pain and joint pain.  Neurological:  Positive for tingling.  All other systems reviewed and are negative.  Otherwise per HPI.  Assessment & Plan: Visit Diagnoses:    ICD-10-CM   1. Lumbar  radiculopathy  M54.16 XR C-ARM NO REPORT    Epidural Steroid injection    methylPREDNISolone acetate (DEPO-MEDROL) injection 40 mg    2. Chronic left-sided low back pain with left-sided sciatica  M54.42    G89.29     3. Pain in left hip  M25.552     4. Buttock pain  M79.18     5. Chronic pain syndrome  G89.4        Plan: Findings:  Chronic and worsening and severe low back left hip and leg pain mostly consistent with lumbar stenosis with MRI findings now of severe multifactorial stenosis at L4-5.  Discussed at length today and decided with the patient to complete left L4 transforaminal epidural steroid injection.  Depending on relief would look at referral to Dr. Willia Craze in the office or neurosurgeon if they choose.  Discussed this at length as well.  He will continue with current medication.  If medication management is warranted this would be referred to a more comprehensive pain management physician.    Meds & Orders:  Meds ordered this encounter  Medications   methylPREDNISolone acetate (DEPO-MEDROL) injection 40 mg    Orders Placed This Encounter  Procedures   XR C-ARM NO REPORT   Epidural Steroid injection    Follow-up: Return if symptoms worsen or fail to improve.  Kenneth [name] - 73 y.o. male MRN [id number]  Date of birth: Nov 06, 1950  Office Visit Note: Visit Date: 04/13/2023 PCP: Ralene Ok, MD Referred by: Ralene Ok, MD  Subjective: Chief Complaint  Patient presents with   Lower Back - Pain   HPI: Kenneth [name] is a 71 y.o. male who comes in today for evaluation and management very severe recalcitrant low back and left hip pain and leg pain and buttock pain particularly with standing and walking.  He has been seen most recently in our office by Ellin Goodie, FNP.  MRI was performed of the lumbar spine and I did review that with him and his wife I believe today with images and spine models.  He actually is doing much better today than when we seen him in the past.  In the past he has been very focused on the amount of pain and what medications he can take etc.  He has been taking some pain medication without much relief.  He actually apologized today for his actions and in the past as he was really just distraught with the amount of pain that he was having.  It is improved a little bit and he is actually doing better today.  He is using a walker for ambulation.  He has not noted focal weakness however.  No neurologic symptoms.  Again pain in the hip and buttock not really the groin but sometimes the groin.  Nothing really on the right side.  MRI does show severe stenosis of the lumbar spine at L4-5 which is multifactorial.  We discussed at length today about surgical referral and the fact that he may need this decompressed at some point.   I spent more than 30 minutes speaking face-to-face with the patient with 50% of the time in counseling and discussing coordination of care.       Review of Systems  Musculoskeletal:  Positive for back pain and joint pain.  Neurological:  Positive for tingling.  All other systems reviewed and are negative.  Otherwise per HPI.  Assessment & Plan: Visit Diagnoses:    ICD-10-CM   1. Lumbar  radiculopathy  M54.16 XR C-ARM NO REPORT    Epidural Steroid injection    methylPREDNISolone acetate (DEPO-MEDROL) injection 40 mg    2. Chronic left-sided low back pain with left-sided sciatica  M54.42    G89.29     3. Pain in left hip  M25.552     4. Buttock pain  M79.18     5. Chronic pain syndrome  G89.4        Plan: Findings:  Chronic and worsening and severe low back left hip and leg pain mostly consistent with lumbar stenosis with MRI findings now of severe multifactorial stenosis at L4-5.  Discussed at length today and decided with the patient to complete left L4 transforaminal epidural steroid injection.  Depending on relief would look at referral to Dr. Willia Craze in the office or neurosurgeon if they choose.  Discussed this at length as well.  He will continue with current medication.  If medication management is warranted this would be referred to a more comprehensive pain management physician.    Meds & Orders:  Meds ordered this encounter  Medications   methylPREDNISolone acetate (DEPO-MEDROL) injection 40 mg    Orders Placed This Encounter  Procedures   XR C-ARM NO REPORT   Epidural Steroid injection    Follow-up: Return if symptoms worsen or fail to improve.  Procedures: No procedures performed  Lumbosacral Transforaminal Epidural Steroid Injection - Sub-Pedicular Approach with Fluoroscopic Guidance  Patient: DELSON JOENS      Date of Birth: 30-Apr-1951 MRN: [id number] PCP: Ralene Ok, MD      Visit Date: 04/13/2023   Universal Protocol:    Date/Time: 04/13/2023  Consent Given By: the patient  Position: PRONE  Additional Comments: Vital signs were monitored before and after the procedure. Patient was prepped and draped in the usual sterile fashion. The correct patient, procedure, and site was verified.   Injection Procedure Details:   Procedure diagnoses: Lumbar radiculopathy [M54.16]    Meds Administered:  Meds ordered this encounter   Medications   methylPREDNISolone acetate (DEPO-MEDROL) injection 40 mg    Laterality: Left  Location/Site: L4  Needle:5.0 in., 22 ga.  Short bevel or Quincke spinal needle  Needle Placement: Transforaminal  Findings:    -Comments: Excellent flow of contrast along the nerve, nerve root and into the epidural space.  Procedure Details: After squaring off the end-plates to get a true AP view, the C-arm was positioned so that an oblique view of the foramen as noted above was visualized. The target area is just inferior to the "nose of the scotty dog" or sub pedicular. The soft tissues overlying this structure were infiltrated with 2-3 ml. of 1% Lidocaine without Epinephrine.  The spinal needle was inserted toward the target using a "trajectory" view along the fluoroscope beam.  Under AP and lateral visualization, the needle was advanced so it did not puncture dura and was located close the 6 O'Clock position of the pedical in AP tracterory. Biplanar projections were used to confirm position. Aspiration was confirmed to be negative for CSF and/or blood. A 1-2 ml. volume of Isovue-250 was injected and flow of contrast was noted at each level. Radiographs were obtained for documentation purposes.   After attaining the desired flow of contrast documented above, a 0.5 to 1.0 ml test dose of 0.25% Marcaine was injected into each respective transforaminal space.  The patient was observed for 90 seconds post injection.  After no sensory deficits were reported, and normal lower extremity motor function was noted,   the above injectate was administered so that equal amounts of the injectate were placed at each foramen (level) into the transforaminal epidural space.   Additional Comments:  No complications occurred Dressing: 2 x 2 sterile gauze and Band-Aid    Post-procedure details: Patient was observed during the procedure. Post-procedure instructions were reviewed.  Patient left the clinic in  stable condition.    Clinical History: CLINICAL DATA:  Low back pain with left buttock pain   EXAM: MRI LUMBAR SPINE WITHOUT CONTRAST   TECHNIQUE: Multiplanar, multisequence MR imaging of the lumbar spine was performed. No intravenous contrast was administered.   COMPARISON:  X-ray 02/10/2023   FINDINGS: Segmentation:  Standard.   Alignment:  Grade 1 anterolisthesis of L4 on L5.   Vertebrae: Mild bone marrow edema within the right L5 pedicle. No fracture, evidence of discitis, or bone lesion. Mild diffuse intrinsic canal narrowing on the basis of congenitally short pedicles.   Conus medullaris and cauda equina: Conus extends to the L1 level. Conus and cauda equina appear normal.   Paraspinal and other soft tissues: Negative.   Disc levels:   T12-L1 through L2-L3: Congenitally short pedicles. No significant disc protrusion, foraminal stenosis, or canal stenosis of these levels.   L3-L4: Minimal annular disc bulge. Mild facet hypertrophy. Congenitally short pedicles. Mild canal and  Kenneth [name] - 73 y.o. male MRN [id number]  Date of birth: Nov 06, 1950  Office Visit Note: Visit Date: 04/13/2023 PCP: Ralene Ok, MD Referred by: Ralene Ok, MD  Subjective: Chief Complaint  Patient presents with   Lower Back - Pain   HPI: Kenneth [name] is a 71 y.o. male who comes in today for evaluation and management very severe recalcitrant low back and left hip pain and leg pain and buttock pain particularly with standing and walking.  He has been seen most recently in our office by Ellin Goodie, FNP.  MRI was performed of the lumbar spine and I did review that with him and his wife I believe today with images and spine models.  He actually is doing much better today than when we seen him in the past.  In the past he has been very focused on the amount of pain and what medications he can take etc.  He has been taking some pain medication without much relief.  He actually apologized today for his actions and in the past as he was really just distraught with the amount of pain that he was having.  It is improved a little bit and he is actually doing better today.  He is using a walker for ambulation.  He has not noted focal weakness however.  No neurologic symptoms.  Again pain in the hip and buttock not really the groin but sometimes the groin.  Nothing really on the right side.  MRI does show severe stenosis of the lumbar spine at L4-5 which is multifactorial.  We discussed at length today about surgical referral and the fact that he may need this decompressed at some point.   I spent more than 30 minutes speaking face-to-face with the patient with 50% of the time in counseling and discussing coordination of care.       Review of Systems  Musculoskeletal:  Positive for back pain and joint pain.  Neurological:  Positive for tingling.  All other systems reviewed and are negative.  Otherwise per HPI.  Assessment & Plan: Visit Diagnoses:    ICD-10-CM   1. Lumbar  radiculopathy  M54.16 XR C-ARM NO REPORT    Epidural Steroid injection    methylPREDNISolone acetate (DEPO-MEDROL) injection 40 mg    2. Chronic left-sided low back pain with left-sided sciatica  M54.42    G89.29     3. Pain in left hip  M25.552     4. Buttock pain  M79.18     5. Chronic pain syndrome  G89.4        Plan: Findings:  Chronic and worsening and severe low back left hip and leg pain mostly consistent with lumbar stenosis with MRI findings now of severe multifactorial stenosis at L4-5.  Discussed at length today and decided with the patient to complete left L4 transforaminal epidural steroid injection.  Depending on relief would look at referral to Dr. Willia Craze in the office or neurosurgeon if they choose.  Discussed this at length as well.  He will continue with current medication.  If medication management is warranted this would be referred to a more comprehensive pain management physician.    Meds & Orders:  Meds ordered this encounter  Medications   methylPREDNISolone acetate (DEPO-MEDROL) injection 40 mg    Orders Placed This Encounter  Procedures   XR C-ARM NO REPORT   Epidural Steroid injection    Follow-up: Return if symptoms worsen or fail to improve.

## 2023-06-08 ENCOUNTER — Other Ambulatory Visit: Payer: Self-pay

## 2023-06-08 ENCOUNTER — Emergency Department (HOSPITAL_COMMUNITY): Payer: Medicare Other

## 2023-06-08 ENCOUNTER — Encounter (HOSPITAL_COMMUNITY): Payer: Self-pay | Admitting: Emergency Medicine

## 2023-06-08 ENCOUNTER — Emergency Department (HOSPITAL_COMMUNITY)
Admission: EM | Admit: 2023-06-08 | Discharge: 2023-06-08 | Disposition: A | Discharge status: Home / Self Care | Payer: Medicare Other | Attending: Emergency Medicine | Admitting: Emergency Medicine

## 2023-06-08 DIAGNOSIS — R42 Dizziness and giddiness: Secondary | ICD-10-CM | POA: Insufficient documentation

## 2023-06-08 DIAGNOSIS — R0602 Shortness of breath: Secondary | ICD-10-CM | POA: Diagnosis not present

## 2023-06-08 DIAGNOSIS — F419 Anxiety disorder, unspecified: Secondary | ICD-10-CM | POA: Insufficient documentation

## 2023-06-08 DIAGNOSIS — I10 Essential (primary) hypertension: Secondary | ICD-10-CM | POA: Insufficient documentation

## 2023-06-08 DIAGNOSIS — G5602 Carpal tunnel syndrome, left upper limb: Secondary | ICD-10-CM | POA: Diagnosis not present

## 2023-06-08 DIAGNOSIS — Z86718 Personal history of other venous thrombosis and embolism: Secondary | ICD-10-CM | POA: Diagnosis not present

## 2023-06-08 DIAGNOSIS — Z7901 Long term (current) use of anticoagulants: Secondary | ICD-10-CM | POA: Insufficient documentation

## 2023-06-08 DIAGNOSIS — Z86711 Personal history of pulmonary embolism: Secondary | ICD-10-CM | POA: Diagnosis not present

## 2023-06-08 DIAGNOSIS — Z79899 Other long term (current) drug therapy: Secondary | ICD-10-CM | POA: Insufficient documentation

## 2023-06-08 LAB — CBG MONITORING, ED: Glucose-Capillary: 136 mg/dL — ABNORMAL HIGH (ref 70–99)

## 2023-06-08 LAB — TROPONIN I (HIGH SENSITIVITY): Troponin I (High Sensitivity): 11 ng/L (ref <18)

## 2023-06-08 LAB — URINALYSIS, ROUTINE W REFLEX MICROSCOPIC
Bilirubin Urine: NEGATIVE
Glucose, UA: NEGATIVE mg/dL
Hgb urine dipstick: NEGATIVE
Ketones, ur: NEGATIVE mg/dL
Leukocytes,Ua: NEGATIVE
Nitrite: NEGATIVE
Protein, ur: NEGATIVE mg/dL
Specific Gravity, Urine: 1.011 (ref 1.005–1.030)
pH: 9 — ABNORMAL HIGH (ref 5.0–8.0)

## 2023-06-08 LAB — CBC
HCT: 42.6 % (ref 39.0–52.0)
Hemoglobin: 13.5 g/dL (ref 13.0–17.0)
MCH: 28.1 pg (ref 26.0–34.0)
MCHC: 31.7 g/dL (ref 30.0–36.0)
MCV: 88.6 fL (ref 80.0–100.0)
Platelets: 246 10*3/uL (ref 150–400)
RBC: 4.81 MIL/uL (ref 4.22–5.81)
RDW: 13.2 % (ref 11.5–15.5)
WBC: 7.5 10*3/uL (ref 4.0–10.5)
nRBC: 0 % (ref 0.0–0.2)

## 2023-06-08 LAB — BASIC METABOLIC PANEL
Anion gap: 11 (ref 5–15)
BUN: 8 mg/dL (ref 8–23)
CO2: 21 mmol/L — ABNORMAL LOW (ref 22–32)
Calcium: 9.7 mg/dL (ref 8.9–10.3)
Chloride: 105 mmol/L (ref 98–111)
Creatinine, Ser: 0.89 mg/dL (ref 0.61–1.24)
GFR, Estimated: >60 mL/min (ref >60)
Glucose, Bld: 138 mg/dL — ABNORMAL HIGH (ref 70–99)
Potassium: 3.6 mmol/L (ref 3.5–5.1)
Sodium: 137 mmol/L (ref 135–145)

## 2023-06-08 LAB — BRAIN NATRIURETIC PEPTIDE: B Natriuretic Peptide: 91.3 pg/mL (ref 0.0–100.0)

## 2023-06-08 MED ORDER — LORAZEPAM 1 MG PO TABS
0.5000 mg | ORAL_TABLET | Freq: Once | ORAL | Status: AC
  Administered 2023-06-08: 0.5 mg via ORAL
  Filled 2023-06-08: qty 1

## 2023-06-08 MED ORDER — IOHEXOL 350 MG/ML SOLN
75.0000 mL | Freq: Once | INTRAVENOUS | Status: AC | PRN
  Administered 2023-06-08: 75 mL via INTRAVENOUS

## 2023-06-08 NOTE — ED Notes (Signed)
EDP Kenneth [name] to bedside to evaluate pt.

## 2023-06-08 NOTE — ED Provider Notes (Signed)
[name] EMERGENCY DEPARTMENT AT [facility] Provider Note   CSN: [id number] Arrival date & time: 06/08/23  1506     History  Chief Complaint  Patient presents with   Dizziness    Kenneth [name] is a 72 y.o. male with a PMH of lymphoma, prior PE and DVT on Eliquis, HTN who presented to the ED for generalized weakness.  Patient reports he had an episode of generalized weakness with his "head feeling heavy" and not being able to see or hear anything that lasted for approximately 10 minutes around 11:30 AM today.  States during this time, he also briefly forgot what his own name was.  He reports he thinks it may have been triggered by strong emotions related to his cat, who has having health problems after she raised her arm in a way that she had never done before and he took so this is a sign that she may die this week.  He reports he is feeling much better overall, but still currently has shortness of breath and the heavy sensation in his head.  He denies chest pain, abdominal pain, vomiting, diarrhea.  States he does not have a history of panic attacks and may have felt this way when he was previously diagnosed with his PE.  Reports he is supposed to be taking Eliquis twice daily, but is only taking it once daily.  Also reports his left hand carpal tunnel has been acting up after chopping wood and using a chainsaw yesterday.   Dizziness      Home Medications Prior to Admission medications   Medication Sig Start Date End Date Taking? Authorizing Provider  allopurinol (ZYLOPRIM) 100 MG tablet Take 100 mg by mouth 2 (two) times daily.  10/27/17   [provider]  apixaban (ELIQUIS) 5 MG TABS tablet Take 5 mg by mouth 2 (two) times daily.    [provider]  calcium carbonate (TUMS - DOSED IN MG ELEMENTAL CALCIUM) 500 MG chewable tablet Chew 1 tablet by mouth at bedtime as needed for indigestion or heartburn.    [provider]  carvedilol (COREG)  12.5 MG tablet TAKE 1 TABLET BY MOUTH TWICE DAILY WITH A MEAL 01/18/21   Lewayne Bunting, MD  cloNIDine (CATAPRES) 0.3 MG tablet Take 0.3 mg by mouth daily. 08/11/18   [provider]  eplerenone (INSPRA) 25 MG tablet Take 25 mg by mouth daily. 06/14/22   [provider]  finasteride (PROSCAR) 5 MG tablet Take 5 mg by mouth daily. 11/30/17   [provider]  hydrALAZINE (APRESOLINE) 50 MG tablet Take 1 tablet (50 mg total) by mouth 2 (two) times daily. 10/27/20 08/03/22  Lewayne Bunting, MD  losartan (COZAAR) 100 MG tablet Take 100 mg by mouth daily. 12/20/17   [provider]  methocarbamol (ROBAXIN-750) 750 MG tablet Take 1 tablet (750 mg total) by mouth 2 (two) times daily as needed for muscle spasms. 02/10/23   Cristie Hem, PA-C  methylPREDNISolone (MEDROL DOSEPAK) 4 MG TBPK tablet Do not take nsaids while on this medication 02/10/23   Cristie Hem, PA-C  oxyCODONE-acetaminophen (PERCOCET/ROXICET) 5-325 MG tablet Take 1 tablet by mouth every 8 (eight) hours as needed for severe pain. 03/28/23   Juanda Chance, NP  rosuvastatin (CRESTOR) 40 MG tablet Take 1 tablet (40 mg total) by mouth daily at 6 PM. 09/05/18   Joseph Art, DO  traMADol (ULTRAM) 50 MG tablet Take 1 tablet (50 mg total)  by mouth every 12 (twelve) hours as needed. 02/10/23   Cristie Hem, PA-C      Allergies    Hctz [hydrochlorothiazide]    Review of Systems   Review of Systems  Neurological:  Positive for dizziness.    Physical Exam Updated Vital Signs BP (!) 175/84 (BP Location: Right Arm)   Pulse 75   Temp 99 F (37.2 C)   Resp 18   Ht 5\' 9"  (1.753 m)   Wt 104.3 kg   SpO2 100%   BMI 33.97 kg/m  Physical Exam Constitutional:      General: He is not in acute distress.    Appearance: Normal appearance. He is not ill-appearing.  HENT:     Head: Normocephalic and atraumatic.     Nose: Nose normal.     Mouth/Throat:     Mouth: Mucous membranes are moist.      Pharynx: Oropharynx is clear.  Eyes:     Extraocular Movements: Extraocular movements intact.     Pupils: Pupils are equal, round, and reactive to light.  Cardiovascular:     Rate and Rhythm: Normal rate and regular rhythm.     Pulses: Normal pulses.     Heart sounds: Normal heart sounds. No murmur heard.    No friction rub. No gallop.  Pulmonary:     Breath sounds: Normal breath sounds. No stridor. No wheezing, rhonchi or rales.  Abdominal:     Palpations: Abdomen is soft.     Tenderness: There is no abdominal tenderness. There is no guarding or rebound.  Musculoskeletal:     Cervical back: Normal range of motion and neck supple.     Right lower leg: No edema.     Left lower leg: No edema.  Skin:    General: Skin is warm and dry.  Neurological:     Mental Status: He is alert.     Comments: Cranial nerves II-XII intact.  Strength 5/5 and sensation intact in all 4 extremities including all fingers of bilateral hands.  Finger-to-nose with no dysmetria.  Gait stable without ataxia     ED Results / Procedures / Treatments   Labs (all labs ordered are listed, but only abnormal results are displayed) Labs Reviewed  BASIC METABOLIC PANEL - Abnormal; Notable for the following components:      Result Value   CO2 21 (*)    Glucose, Bld 138 (*)    All other components within normal limits  URINALYSIS, ROUTINE W REFLEX MICROSCOPIC - Abnormal; Notable for the following components:   pH 9.0 (*)    All other components within normal limits  CBG MONITORING, ED - Abnormal; Notable for the following components:   Glucose-Capillary 136 (*)    All other components within normal limits  CBC  BRAIN NATRIURETIC PEPTIDE  TROPONIN I (HIGH SENSITIVITY)    EKG None  Radiology CT Angio Chest PE W and/or Wo Contrast  Result Date: 06/08/2023 CLINICAL DATA:  Dizziness and weakness. EXAM: CT ANGIOGRAPHY CHEST WITH CONTRAST TECHNIQUE: Multidetector CT imaging of the chest was performed using the  standard protocol during bolus administration of intravenous contrast. Multiplanar CT image reconstructions and MIPs were obtained to evaluate the vascular anatomy. RADIATION DOSE REDUCTION: This exam was performed according to the departmental dose-optimization program which includes automated exposure control, adjustment of the mA and/or kV according to patient size and/or use of iterative reconstruction technique. CONTRAST:  75mL OMNIPAQUE IOHEXOL 350 MG/ML SOLN COMPARISON:  08/02/2022 FINDINGS: Cardiovascular: Delayed but  adequate pulmonary arterial opacification. No filling defect is identified in the pulmonary arterial tree to suggest pulmonary embolus. Aberrant right subclavian artery passes behind the esophagus. Ascending aortic aneurysm 4.3 cm in diameter on image 81 series 5, stable by my measurements. Atheromatous vascular calcification of the left anterior descending coronary artery and aberrant right subclavian artery. Mild cardiomegaly. Mediastinum/Nodes: No pathologic adenopathy. Lungs/Pleura: Airway thickening is present, suggesting bronchitis or reactive airways disease. Mild scattered air trapping in the lungs. Upper Abdomen: Upper abdominal adenopathy, including a 2.1 by 2.0 cm soft tissue mass posterior to the pancreas partially surrounding the proximal splenic artery and an adjacent 1.3 cm peripancreatic lymph node (images 147 and 141 of series 5, respectively). Central mesenteric stranding with scattered mesenteric lymph nodes in the upper abdomen. These findings have been reported as waxing and waning in the past in this patient with history of non-Hodgkin's lymphoma. Active lymphoma is not excluded. 1.4 cm left adrenal adenoma. No further imaging workup of this lesion is indicated. Musculoskeletal: Thoracic spondylosis. Review of the MIP images confirms the above findings. IMPRESSION: 1. No filling defect is identified in the pulmonary arterial tree to suggest pulmonary embolus. 2. Airway  thickening is present, suggesting bronchitis or reactive airways disease. Mild scattered air trapping in the lungs. 3. Upper abdominal adenopathy and central mesenteric stranding, in this patient with history of non-Hodgkin's lymphoma. Active lymphoma is not excluded. 4. 1.4 cm left adrenal adenoma. No further imaging workup of this lesion is indicated. 5. Stable ascending aortic aneurysm 4.3 cm in diameter. Recommend annual imaging followup by CTA or MRA. This recommendation follows 2010 ACCF/AHA/AATS/ACR/ASA/SCA/SCAI/SIR/STS/SVM Guidelines for the Diagnosis and Management of Patients with Thoracic Aortic Disease. Circulation. 2010; 121: G956-O130. Aortic aneurysm NOS (ICD10-I71.9) 6. Aberrant right subclavian artery passes behind the esophagus. 7. Mild cardiomegaly. 8. Atheromatous vascular calcification of the left anterior descending coronary artery and aberrant right subclavian artery. 9. Thoracic spondylosis. Electronically Signed   By: Gaylyn Rong M.D.   On: 06/08/2023 19:06   CT HEAD WO CONTRAST  Result Date: 06/08/2023 CLINICAL DATA:  Dizziness.  Loss of vision in the left eye. EXAM: CT HEAD WITHOUT CONTRAST TECHNIQUE: Contiguous axial images were obtained from the base of the skull through the vertex without intravenous contrast. RADIATION DOSE REDUCTION: This exam was performed according to the departmental dose-optimization program which includes automated exposure control, adjustment of the mA and/or kV according to patient size and/or use of iterative reconstruction technique. COMPARISON:  None Available. FINDINGS: Brain: No acute infarct, hemorrhage, or mass lesion is present. No significant white matter lesions are present. The ventricles are of normal size. No significant extraaxial fluid collection is present. The brainstem and cerebellum are within normal limits. A relatively empty sella is present. Midline structures are otherwise within normal limits. Vascular: No hyperdense vessel  or unexpected calcification. Skull: Calvarium is intact. No focal lytic or blastic lesions are present. No significant extracranial soft tissue lesion is present. Sinuses/Orbits: The paranasal sinuses and mastoid air cells are clear. The globes and orbits are within normal limits. IMPRESSION: 1. Negative CT of the brain. No acute or focal lesion to explain the patient's symptoms. 2. Relatively empty sella. This is a nonspecific finding but can be seen in the setting of idiopathic intracranial hypertension. Electronically Signed   By: Marin Roberts M.D.   On: 06/08/2023 18:44    Procedures Procedures    Medications Ordered in ED Medications  LORazepam (ATIVAN) tablet 0.5 mg (0.5 mg Oral Given 06/08/23 1655)  iohexol (OMNIPAQUE) 350 MG/ML injection 75 mL (75 mLs Intravenous Contrast Given 06/08/23 1839)    ED Course/ Medical Decision Making/ A&P Clinical Course as of 06/08/23 2304  Thu Jun 08, 2023  1643 Stable  Stressed by a sick animal at home Visual sxs, confusion, SOB, near syncope. HX of PE Nonadherent to anticoagulation  [CC]    Clinical Course User Index [CC] Glyn Ade, MD                                 Medical Decision Making Amount and/or Complexity of Data Reviewed Labs: ordered. Radiology: ordered.  Risk Prescription drug management.   Vital signs stable, physical exam reassuring without neurologic deficit concerning for stroke.  CBC with no leukocytosis or anemia.  Metabolic panel with no gross metabolic or electrolyte abnormality.  Troponin 11, BNP within normal limits.  Blood sugar 136.  UA not concerning for infection.  Etiology of the patient's symptoms sound like they may have been triggered by stress related to his concern about the health of his cat.  He reported significant improvement of his symptoms after 0.5 mg Ativan and reports he is now feeling back at his baseline.  CT head obtained, which showed no acute intracranial abnormality.  Patient  reported to me that he was only taking half the dose of his Eliquis, given his shortness of breath CTA PE was obtained which showed no evidence of PE.  Presentation is not consistent with an acute stroke, ACS.  Results of laboratory and imaging evaluations were discussed with the patient and sister-in-law at bedside.  Discussed using Eliquis and all medications as prescribed.  Encouraged patient to follow-up with PCP.  Strict return precautions were discussed, and the patient voiced understanding.  Patient was discharged in stable condition.        Final Clinical Impression(s) / ED Diagnoses Final diagnoses:  Anxiety    Rx / DC Orders ED Discharge Orders     None         Janyth Pupa, MD 06/08/23 4098    Glyn Ade, MD 06/09/23 1506

## 2023-06-08 NOTE — ED Triage Notes (Signed)
Pt here for dizziness, "weakness in my head", and states that he felt he lost vision in his L eye, states that he was not able to remember his name briefly, that he feels that he can't hear people as loudly as normal and "trouble thinking" states that he had a panic attack at around 12:30 and symptoms started after. Stated that he had episode of dizziness on Tuesday as well and was normal yesterday. States his cat is dying and this is a stressful situation for him that he is experiencing at this time.

## 2023-06-08 NOTE — Discharge Instructions (Signed)
You were seen here today for multiple complaints.  Your workup showed no blood clot in your heart or problem with your brain on CT scan.  Please use all medications as prescribed.  Please follow-up with your PCP.  Please return to the emergency department for chest pain, shortness of breath, severe vomiting or inability to keep down food, loss of consciousness, or any worsening symptom or concern.

## 2023-07-24 IMAGING — US US RENAL ARTERY STENOSIS
1 series · 13 of 25 positions shown · non-contrast
Comparison: None.

CLINICAL DATA: Uncontrolled hypertension

EXAM:
RENAL/URINARY TRACT ULTRASOUND
RENAL DUPLEX DOPPLER ULTRASOUND

[Series 1: us renal artery stenosis · 0.23mm/px · 13 of 55 slices shown]
[im 1/55]
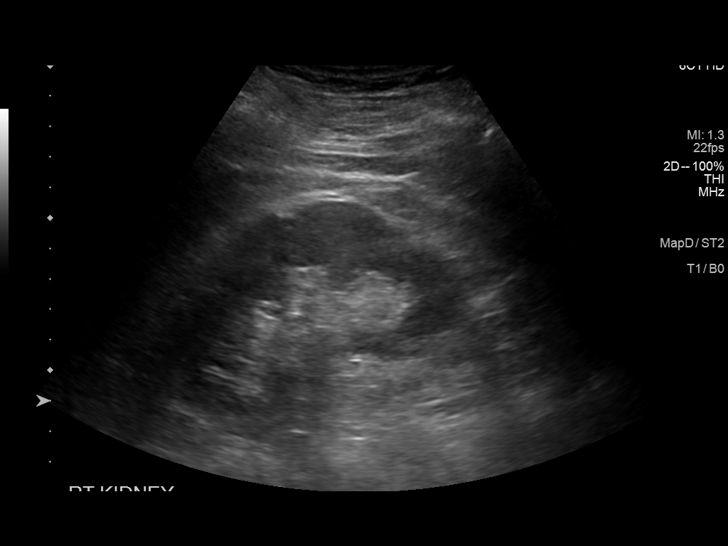
[im 5/55]
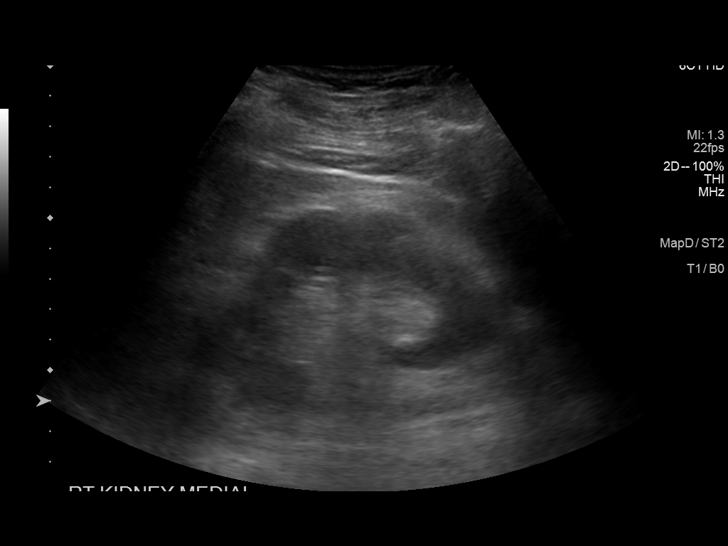
[im 10/55]
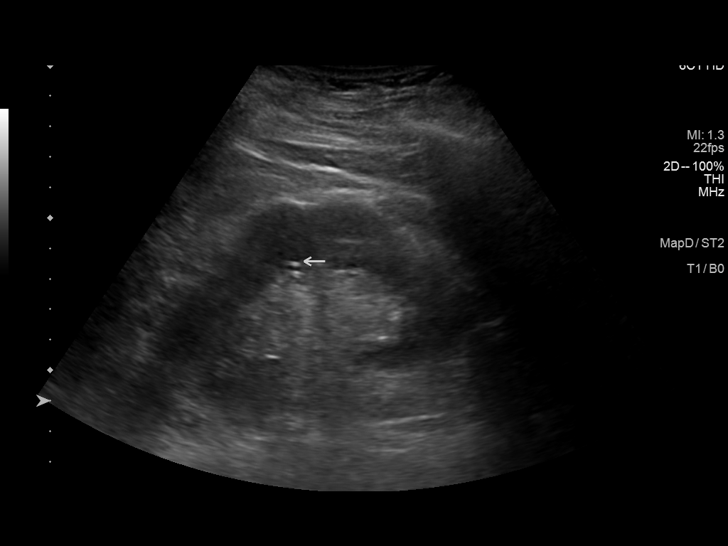
[im 14/55]
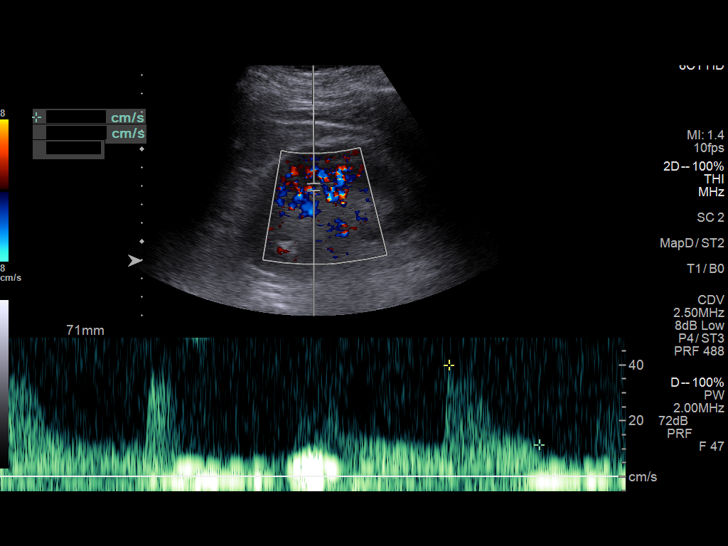
[im 19/55]
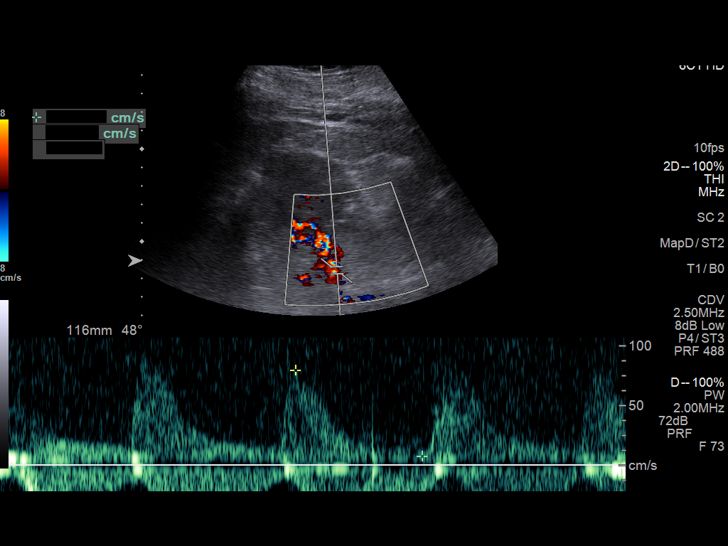
[im 23/55]
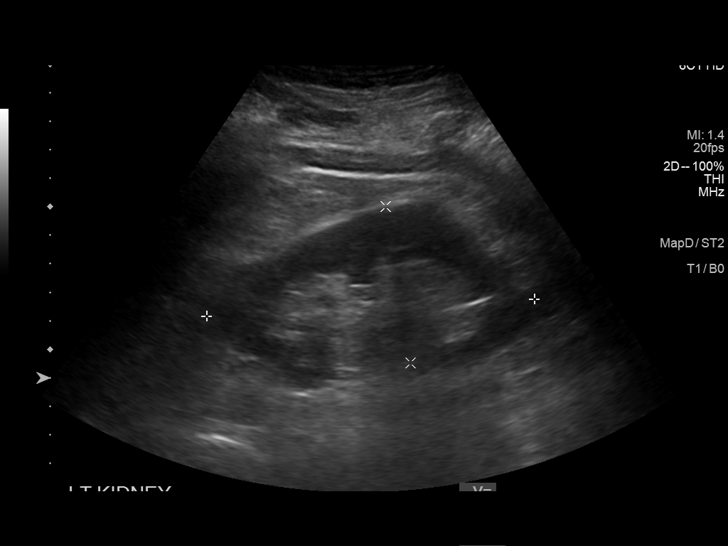
[im 28/55]
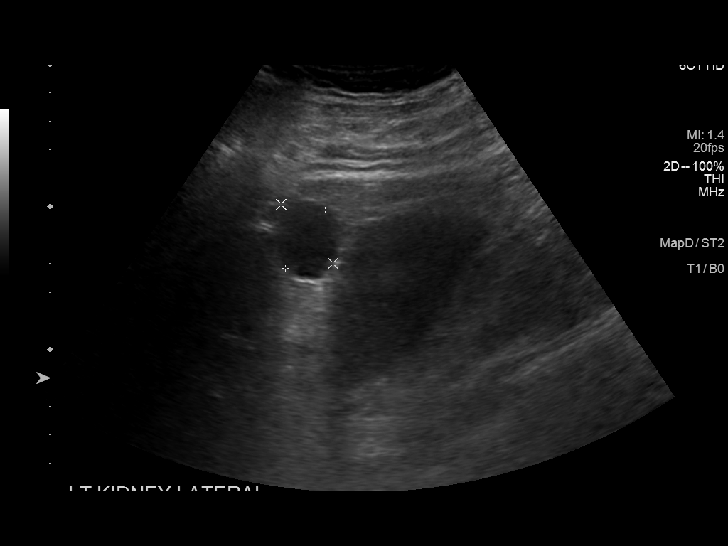
[im 32/55]
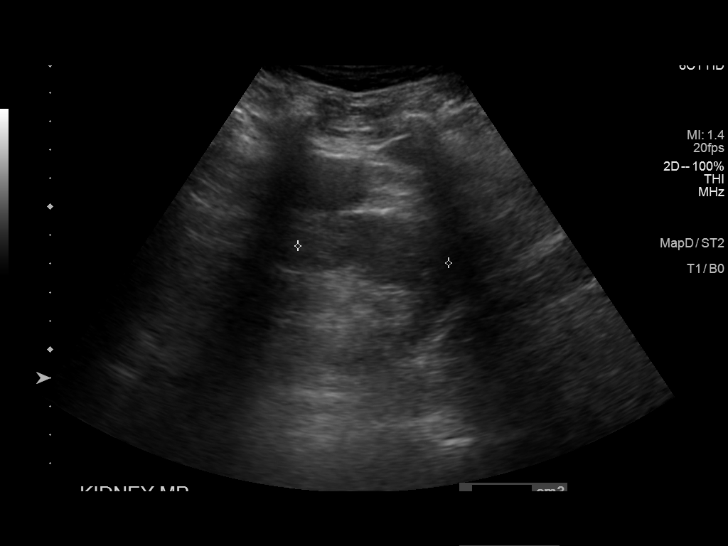
[im 37/55]
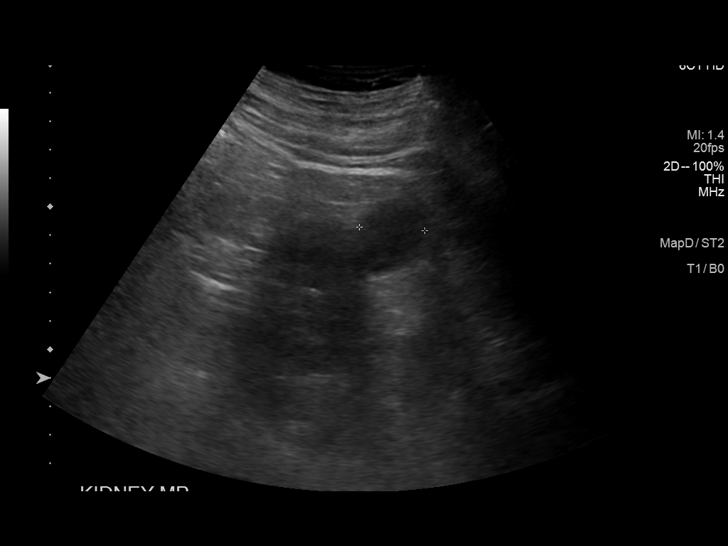
[im 41/55]
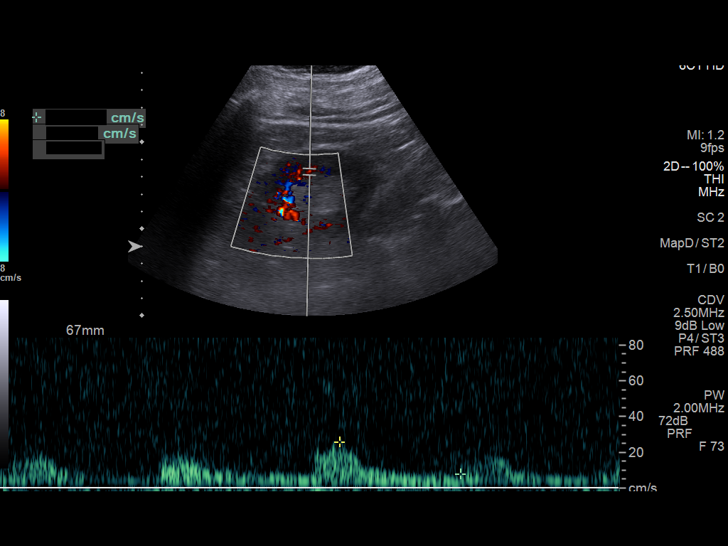
[im 46/55]
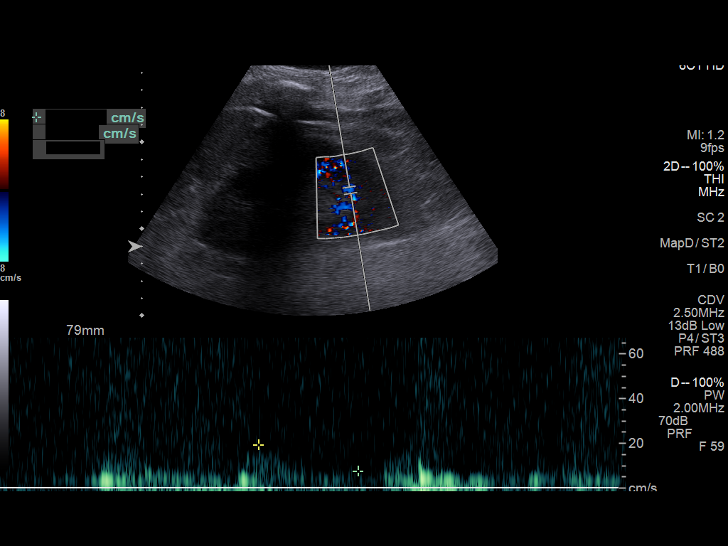
[im 50/55]
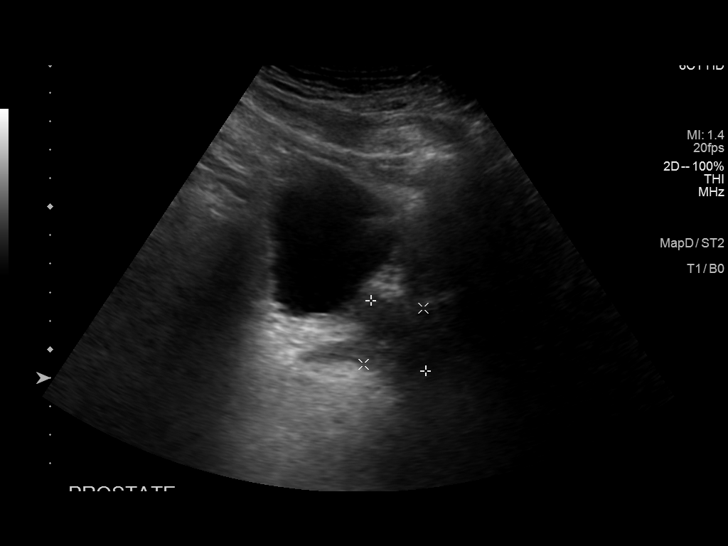
[im 55/55]
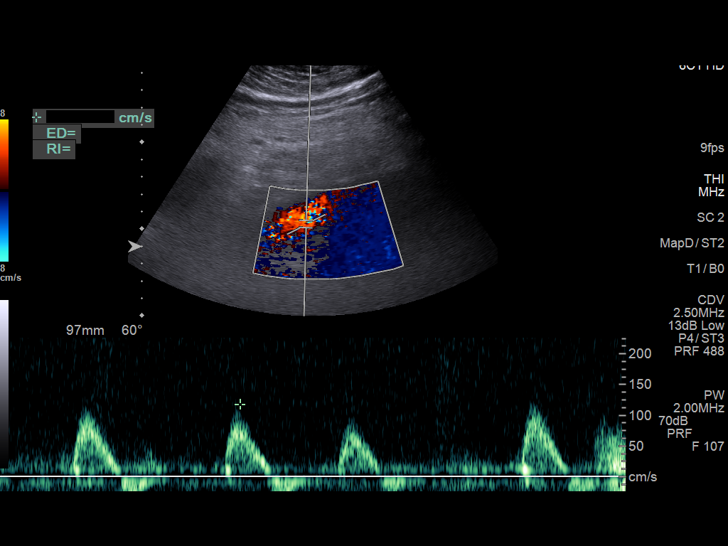

[13 of 25 positions shown; findings below may reference images not displayed]

FINDINGS: Right Kidney:

Length: 10.9. Echogenicity within normal limits. No mass or
hydronephrosis visualized.

Left Kidney:

Length: 11.5. Echogenicity within normal limits. No hydronephrosis.
Nonobstructive left nephrolithiasis at the lower pole. Exophytic
simple renal cyst at the midpole measuring up to 2.8 cm.

Bladder:  Unremarkable.

RENAL DUPLEX ULTRASOUND

Right Renal Artery Velocities:

Origin:  108 cm/sec

Mid:  79 cm/sec

Hilum:  98 cm/sec

Interlobar:  41 cm/sec

Arcuate:  37 cm/sec.  Normal resistive indices.

Left Renal Artery Velocities:

Origin:  96 cm/sec

Mid:  84 cm/sec

Hilum:  58 cm/sec

Interlobar:  37 cm/sec

Arcuate:  37 cm/sec.  Normal resistive indices.

Aortic Velocity:  118 cm/sec

Right Renal-Aortic Ratios:

Origin:

Mid:

Hilum:

Interlobar:

Arcuate:

Left Renal-Aortic Ratios:

Origin:

Mid:

Hilum:

Interlobar:

Arcuate:

Renal veins: The bilateral renal veins are patent.
IMPRESSION: 1. Normal Doppler exam of the renal vasculature. No findings of
renal artery stenosis.
2. Nonobstructive left nephrolithiasis. Otherwise normal sonographic
appearance of the kidneys without hydronephrosis.

## 2023-07-24 IMAGING — CT CT ANGIO CHEST
1 of 2 series · 18 of 32 positions shown · IV contrast (APPLIED)
Comparison: July 2020, multiple priors

CLINICAL DATA: Aortic aneurysm, known or suspected

EXAM:
CT ANGIOGRAPHY CHEST WITH CONTRAST
TECHNIQUE: Multidetector CT imaging of the chest was performed using the
standard protocol during bolus administration of intravenous
contrast. Multiplanar CT image reconstructions and MIPs were
obtained to evaluate the vascular anatomy.

[Series 9: thins · axial · 0.83mm/px · z∈[-166,+203]mm · 18 of 504 slices shown]
[im 21/504  lung]
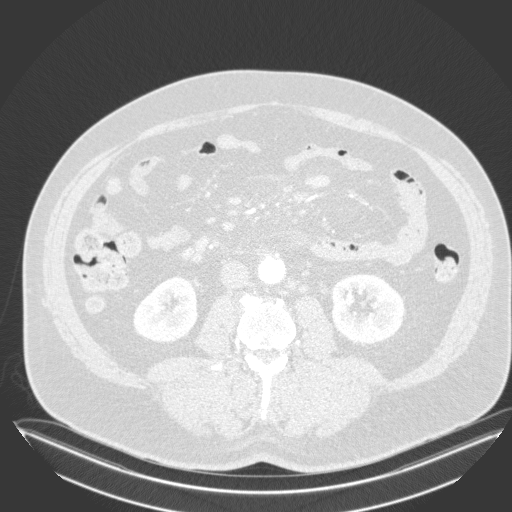
[im 42/504  soft-tissue]
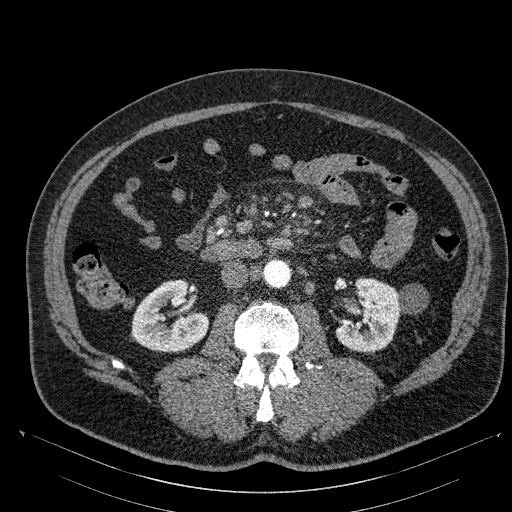
[im 84/504  lung]
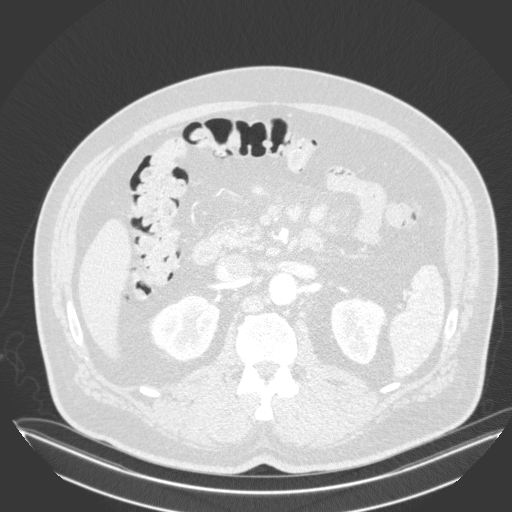
[im 105/504  soft-tissue]
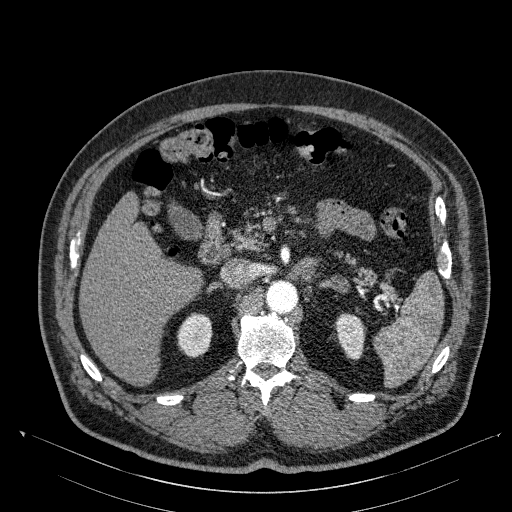
[im 126/504  lung]
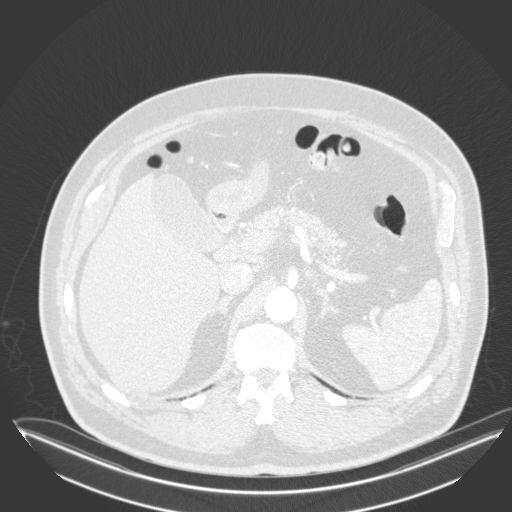
[im 168/504  soft-tissue]
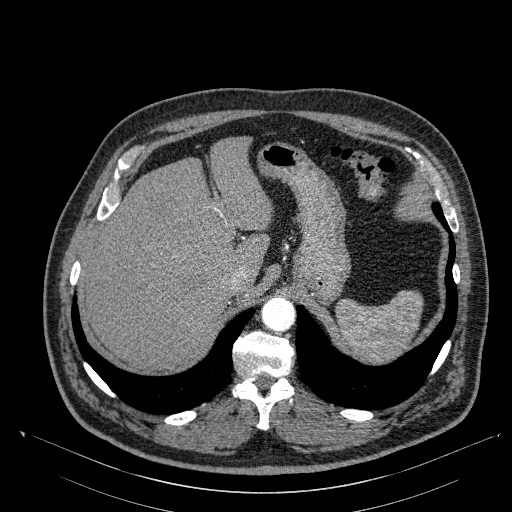
[im 189/504  lung]
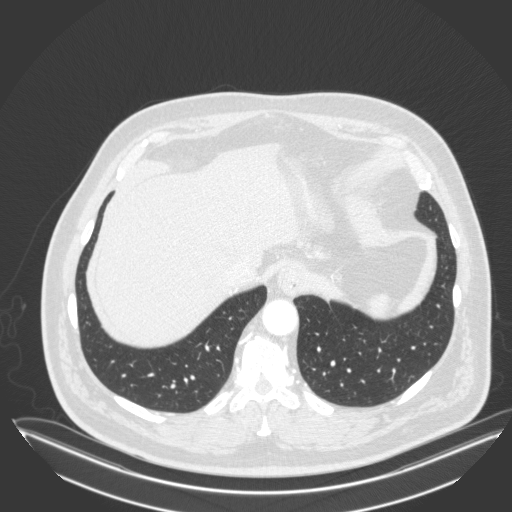
[im 210/504  soft-tissue]
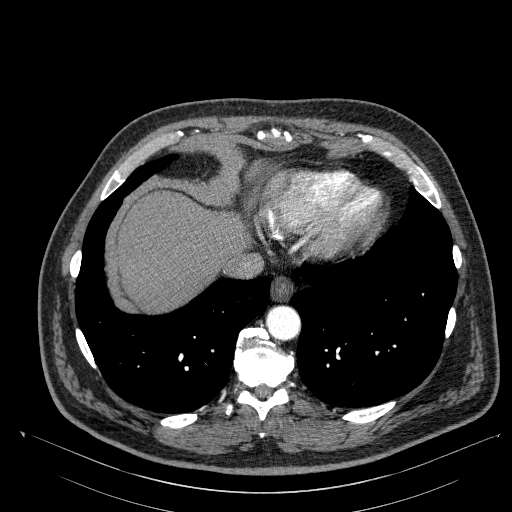
[im 231/504  lung]
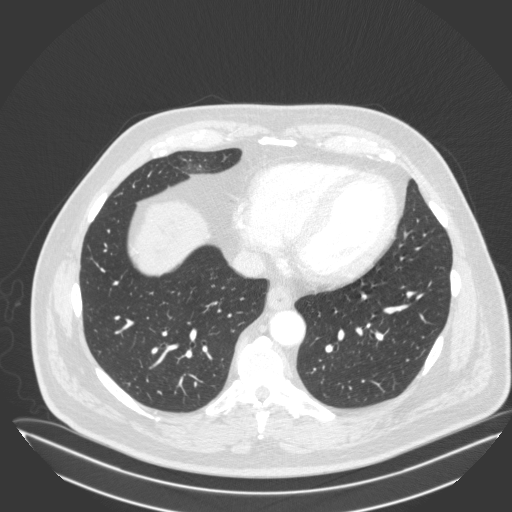
[im 273/504  soft-tissue]
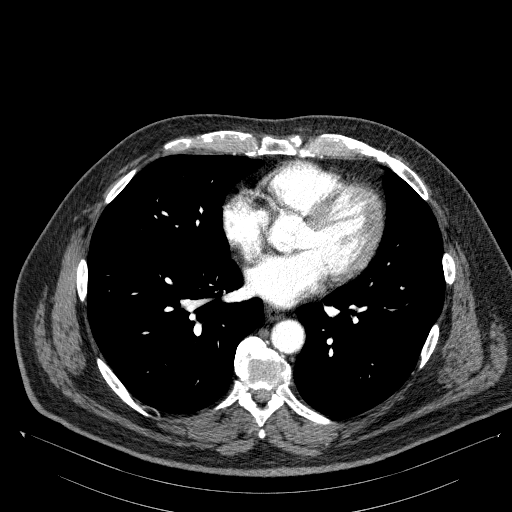
[im 294/504  lung]
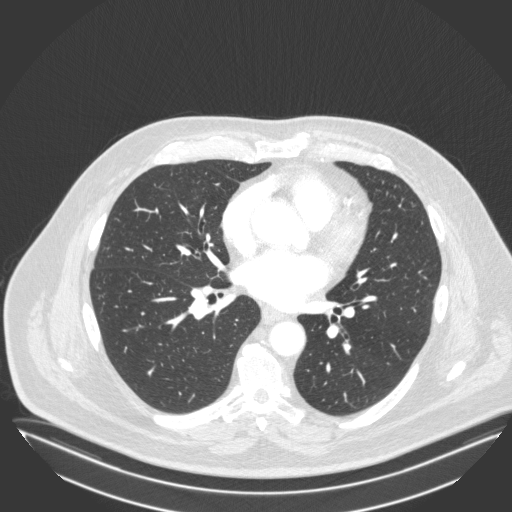
[im 315/504  soft-tissue]
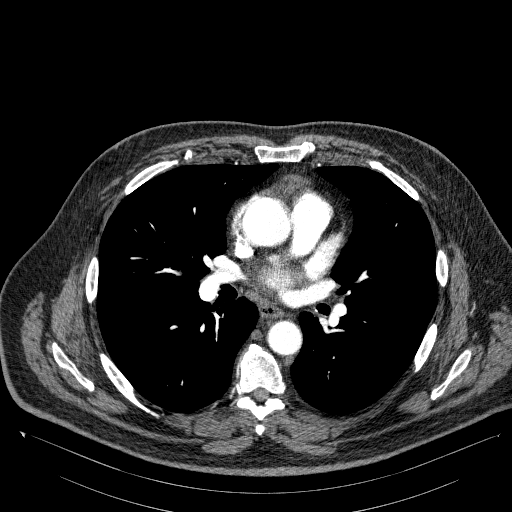
[im 336/504  lung]
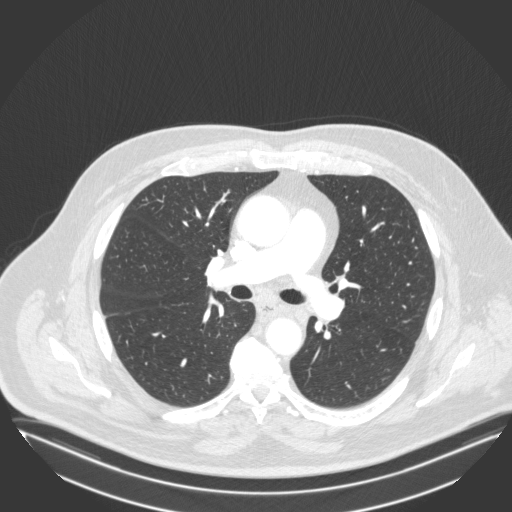
[im 378/504  soft-tissue]
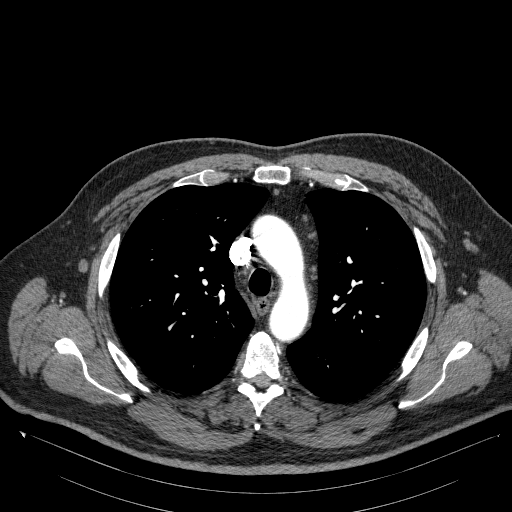
[im 399/504  lung]
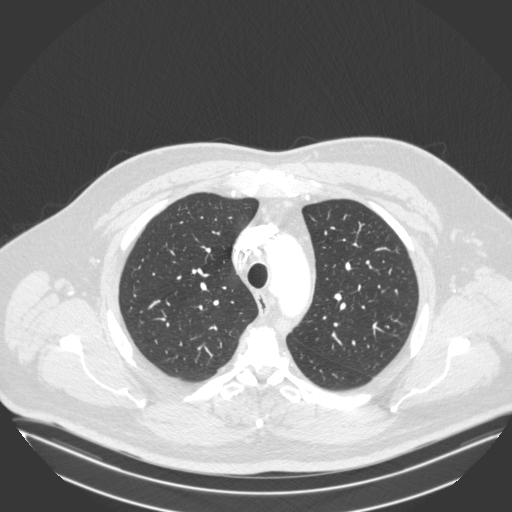
[im 420/504  soft-tissue]
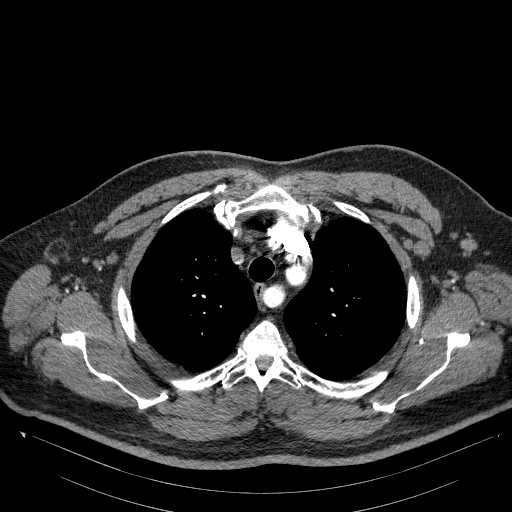
[im 462/504  lung]
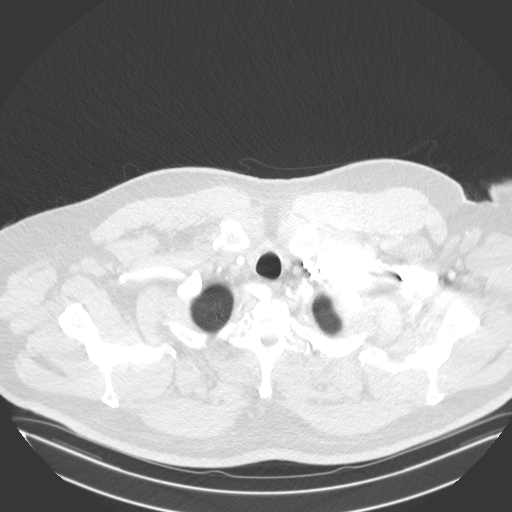
[im 483/504  soft-tissue]
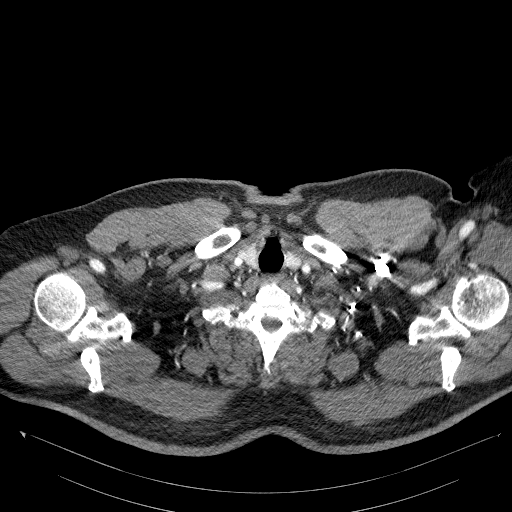

[18 of 32 positions shown; findings below may reference images not displayed]

RADIATION DOSE REDUCTION: This exam was performed according to the
departmental dose-optimization program which includes automated
exposure control, adjustment of the mA and/or kV according to
patient size and/or use of iterative reconstruction technique.

CONTRAST:  80mL [id number]-EVV IOPAMIDOL ([id number]) INJECTION 61%
FINDINGS: Cardiovascular: Preferential opacification of the thoracic aorta.
Ascending thoracic aortic aneurysm measures 4.1 x 4.0 cm, stable in
size. Note is again made of variant anatomy with an aberrant origin
of the right subclavian artery. Pulmonary arteries are normal in
caliber without central filling defects. Normal heart size. No
pericardial effusion.

Mediastinum/Nodes: Increased prominence and borderline enlarged
bilateral axillary/subpectoral lymph nodes, for example measuring up
to 1.1 cm on the right, and 1.2 cm on the left. The thyroid gland
appears normal.

Lungs/Pleura: No pleural effusion. No pneumothorax. There is a focus
of ground-glass consolidation in the right middle lobe.

Musculoskeletal: No aggressive osseous lesions.

Upper abdomen: In the central mesenteric, again seen is an
encapsulated area of slightly hyperdense mesenteric fat with
innumerable nodules/lymph nodes, which appear intervally enlarged
since most recent prior in July 2020, for example lymph
node/nodule measuring up to 1.5 x 1.2 cm, which appear to previously
measure up to 0.9 x 0.7 cm. There are a number of other similarly
sized enlarged soft tissue nodule/lymph nodes, for example measuring
up to 1.9 x 1.1 cm. Stable appearance of left adrenal nodule and
left simple renal cyst.

Review of the MIP images confirms the above findings.
IMPRESSION: 1. Stable appearance of ascending thoracic aortic aneurysm measuring
up to 4.1 cm. Recommend annual imaging followup by CTA or MRA. This
recommendation follows 6252
ACCF/AHA/AATS/ACR/ASA/SCA/PIKEC/YANTJE/FAUL/NDJOBA Guidelines for the
Diagnosis and Management of Patients with Thoracic Aortic Disease.
Circulation. 6252; 121: E266-e369. Aortic aneurysm NOS (J6OUV-9AH.L)
2. Again seen is a cluster of soft tissue nodules/lymph nodes in the
central mesentery which are intervally increased in size since
July 2020. Additionally, there are now enlarged bilateral
axillary/subpectoral lymph nodes. Per EMR review, patient has a
history of non-Hodgkin's lymphoma, and these findings raise concern
for worsening disease.

These results will be called to the ordering clinician or
representative by the Radiologist Assistant, and communication
documented in the PACS or [REDACTED].

## 2023-09-28 ENCOUNTER — Ambulatory Visit: Admitting: Surgical

## 2023-10-13 ENCOUNTER — Other Ambulatory Visit: Payer: Self-pay | Admitting: Physical Medicine and Rehabilitation

## 2023-10-13 ENCOUNTER — Ambulatory Visit: Admitting: Surgical

## 2023-10-13 ENCOUNTER — Encounter: Payer: Self-pay | Admitting: Surgical

## 2023-10-13 ENCOUNTER — Telehealth: Payer: Self-pay | Admitting: Physical Medicine and Rehabilitation

## 2023-10-13 DIAGNOSIS — M170 Bilateral primary osteoarthritis of knee: Secondary | ICD-10-CM | POA: Diagnosis not present

## 2023-10-13 DIAGNOSIS — M5416 Radiculopathy, lumbar region: Secondary | ICD-10-CM

## 2023-10-13 MED ORDER — LIDOCAINE HCL 1 % IJ SOLN
5.0000 mL | INTRAMUSCULAR | Status: AC | PRN
Start: 2023-10-13 — End: 2023-10-13
  Administered 2023-10-13: 5 mL

## 2023-10-13 MED ORDER — BUPIVACAINE HCL 0.25 % IJ SOLN
4.0000 mL | INTRAMUSCULAR | Status: AC | PRN
Start: 2023-10-13 — End: 2023-10-13
  Administered 2023-10-13: 4 mL via INTRA_ARTICULAR

## 2023-10-13 MED ORDER — METHYLPREDNISOLONE ACETATE 40 MG/ML IJ SUSP
40.0000 mg | INTRAMUSCULAR | Status: AC | PRN
Start: 2023-10-13 — End: 2023-10-13
  Administered 2023-10-13: 40 mg via INTRA_ARTICULAR

## 2023-10-13 NOTE — Progress Notes (Signed)
 Office Visit Note   Patient: Kenneth [name]           Date of Birth: [birth year]           MRN: [id number] Visit Date: 10/13/2023 Requested by: Ralene Ok, MD [id number] [place],  [place] [address] PCP: Ralene Ok, MD  Subjective: Chief Complaint  Patient presents with   Left Knee - Pain   Right Knee - Pain    HPI: YUVIN BUSSIERE is a 73 y.o. male who presents to the office reporting knee pain.  Has history of knee arthritis.  No new falls or injuries.  No fevers or chills.  Previous injections have provided good relief and they are here today to repeat injections.  Last injection was on 03/08/2023.  He is pursuing weight loss to see if this will help with his knee pain and has lost 10 pounds in the last 6 months.  Left knee bothers him more than the right knee.  Primarily bothers him with weightbearing.  No groin pain or radicular pain..                ROS: All systems reviewed are negative as they relate to the chief complaint within the history of present illness.  Patient denies fevers or chills.  Assessment & Plan: Visit Diagnoses:  1. Bilateral primary osteoarthritis of knee     Plan: Patient is a 73 year old male who presents for repeat cortisone injection.  He would like bilateral knee injections today.  Left knee was aspirated of 27 cc of nonpurulent synovial fluid with aspiration of 15 cc of similar fluid from the right knee prior to injection of cortisone into both knees.  Tolerated procedure well without complication.  Follow-up with the office as needed with next possible injection in about 3 to 4 months  We also discussed that he has numbness and tingling in the median nerve distribution in his left hand.  This bothers him a fair amount with fairly consistent numbness and tingling.  He states that it has been numb "for 10 years".  We discussed the concept of carpal tunnel release and the possibility that surgical intervention may yield no improvement at this point  with his symptoms lasting as long as they have.  He wants to try carpal tunnel injection which I counseled him extensively will likely not provide any long-term relief for him and may only be very short-term relief if any.  He understands and will consider his options and reach out to [place] with what he would like to do.  Follow-Up Instructions: No follow-ups on file.   Orders:  No orders of the defined types were placed in this encounter.  No orders of the defined types were placed in this encounter.     Procedures: Large Joint Inj: bilateral knee on 10/13/2023 2:00 PM Indications: diagnostic evaluation, joint swelling and pain Details: 18 G 1.5 in needle, superolateral approach  Arthrogram: No  Medications (Right): 5 mL lidocaine 1 %; 4 mL bupivacaine 0.25 %; 40 mg methylPREDNISolone acetate 40 MG/ML Aspirate (Right): 15 mL Medications (Left): 5 mL lidocaine 1 %; 4 mL bupivacaine 0.25 %; 40 mg methylPREDNISolone acetate 40 MG/ML Aspirate (Left): 27 mL Outcome: tolerated well, no immediate complications Procedure, treatment alternatives, risks and benefits explained, specific risks discussed. Consent was given by the patient. Immediately prior to procedure a time out was called to verify the correct patient, procedure, equipment, support staff and site/side marked as required. Patient was prepped and  draped in the usual sterile fashion.       Clinical Data: No additional findings.  Objective: Vital Signs: There were no vitals taken for this visit.  Physical Exam:  Constitutional: Patient appears well-developed HEENT:  Head: Normocephalic Eyes:EOM are normal Neck: Normal range of motion Cardiovascular: Normal rate Pulmonary/chest: Effort normal Neurologic: Patient is alert Skin: Skin is warm Psychiatric: Patient has normal mood and affect  Ortho Exam: Ortho exam demonstrates knees without cellulitis or skin changes.  Effusion present in both knees with left effusion greater  than right effusion.  No calf tenderness.  Negative Homans' sign.  No pain with hip range of motion.  Able to perform straight leg raise with both lower extremities.  Lower extremities warm and well-perfused.  Stable to anterior and posterior drawer sign bilaterally.  He has range of motion with 5 degrees extension on the left knee and 3 degrees extension on the right knee.  He has about 105 degrees of knee flexion on the left knee and 115 degrees of knee flexion on the right knee.  Tender over the medial joint line bilaterally.  No tenderness over the lateral joint line bilaterally.  Specialty Comments:  CLINICAL DATA:  Low back pain with left buttock pain   EXAM: MRI LUMBAR SPINE WITHOUT CONTRAST   TECHNIQUE: Multiplanar, multisequence MR imaging of the lumbar spine was performed. No intravenous contrast was administered.   COMPARISON:  X-ray 02/10/2023   FINDINGS: Segmentation:  Standard.   Alignment:  Grade 1 anterolisthesis of L4 on L5.   Vertebrae: Mild bone marrow edema within the right L5 pedicle. No fracture, evidence of discitis, or bone lesion. Mild diffuse intrinsic canal narrowing on the basis of congenitally short pedicles.   Conus medullaris and cauda equina: Conus extends to the L1 level. Conus and cauda equina appear normal.   Paraspinal and other soft tissues: Negative.   Disc levels:   T12-L1 through L2-L3: Congenitally short pedicles. No significant disc protrusion, foraminal stenosis, or canal stenosis of these levels.   L3-L4: Minimal annular disc bulge. Mild facet hypertrophy. Congenitally short pedicles. Mild canal and bilateral foraminal stenosis.   L4-L5: Anterolisthesis with disc uncovering and diffuse disc bulge. Moderate bilateral facet arthropathy with ligamentum flavum buckling. Severe canal stenosis with moderate-severe bilateral foraminal stenosis.   L5-S1: Annular disc bulge with mild-to-moderate bilateral facet arthropathy. No canal  stenosis. Moderate-severe left and moderate right foraminal stenosis.   IMPRESSION: 1. Multilevel lumbar spondylosis superimposed on a congenitally narrow canal. Findings are most pronounced at L4-L5 where there is severe canal stenosis and moderate-severe bilateral foraminal stenosis. 2. Moderate-severe left and moderate right foraminal stenosis at L5-S1. 3. Mild bone marrow edema within the right L5 pedicle, which may be stress-related.     Electronically Signed   By: Duanne Guess D.O.   On: 03/27/2023 15:36  Imaging: No results found.   PMFS History: Patient Active Problem List   Diagnosis Date Noted   Deep venous thrombosis of peroneal vein (HCC) 05/11/2023   Ascending aortic aneurysm (HCC) 09/04/2018   PE (pulmonary thromboembolism) (HCC) 09/04/2018   Palpitations 09/04/2018   Hypertension    Hypertensive urgency 09/03/2018   Elevated troponin 09/03/2018   Dyspnea 09/03/2018   Past Medical History:  Diagnosis Date   Ascending aortic aneurysm (HCC) 09/04/2018   Dyspnea 09/03/2018   Elevated troponin 09/03/2018   Gout    Hypercholesteremia    Hypertension    Nephrolithiasis    Palpitations 09/04/2018   PE (pulmonary thromboembolism) (HCC)  09/04/2018   Pulmonary embolism (HCC)     Family History  Problem Relation Age of Onset   Diabetes Mother    Hypertension Mother    CAD Brother        CABG at age 36    Past Surgical History:  Procedure Laterality Date   No prior surgery     Social History   Occupational History   Not on file  Tobacco Use   Smoking status: Never   Smokeless tobacco: Never  Substance and Sexual Activity   Alcohol use: Never   Drug use: Never   Sexual activity: Not on file

## 2023-10-13 NOTE — Telephone Encounter (Signed)
 Patient came in and wanted to know if he could get another shot in the lumbar area. VW#[phone]

## 2024-03-15 ENCOUNTER — Other Ambulatory Visit: Payer: Self-pay

## 2024-03-15 ENCOUNTER — Inpatient Hospital Stay: Payer: Medicare Other | Attending: Hematology

## 2024-03-15 ENCOUNTER — Inpatient Hospital Stay: Payer: Medicare Other | Admitting: Hematology

## 2024-03-15 VITALS — BP 147/73 | HR 70 | Temp 97.5°F | Resp 20 | Wt 252.4 lb

## 2024-03-15 DIAGNOSIS — R5383 Other fatigue: Secondary | ICD-10-CM | POA: Insufficient documentation

## 2024-03-15 DIAGNOSIS — D3502 Benign neoplasm of left adrenal gland: Secondary | ICD-10-CM | POA: Insufficient documentation

## 2024-03-15 DIAGNOSIS — M109 Gout, unspecified: Secondary | ICD-10-CM | POA: Insufficient documentation

## 2024-03-15 DIAGNOSIS — Z86711 Personal history of pulmonary embolism: Secondary | ICD-10-CM | POA: Insufficient documentation

## 2024-03-15 DIAGNOSIS — M170 Bilateral primary osteoarthritis of knee: Secondary | ICD-10-CM | POA: Diagnosis not present

## 2024-03-15 DIAGNOSIS — C8580 Other specified types of non-Hodgkin lymphoma, unspecified site: Secondary | ICD-10-CM

## 2024-03-15 DIAGNOSIS — Z79899 Other long term (current) drug therapy: Secondary | ICD-10-CM | POA: Insufficient documentation

## 2024-03-15 DIAGNOSIS — Z7901 Long term (current) use of anticoagulants: Secondary | ICD-10-CM | POA: Insufficient documentation

## 2024-03-15 DIAGNOSIS — Z85820 Personal history of malignant melanoma of skin: Secondary | ICD-10-CM | POA: Insufficient documentation

## 2024-03-15 DIAGNOSIS — Z8572 Personal history of non-Hodgkin lymphomas: Secondary | ICD-10-CM | POA: Insufficient documentation

## 2024-03-15 LAB — LACTATE DEHYDROGENASE: LDH: 166 U/L (ref 98–192)

## 2024-03-15 LAB — CBC WITH DIFFERENTIAL (CANCER CENTER ONLY)
Abs Immature Granulocytes: 0.01 K/uL (ref 0.00–0.07)
Basophils Absolute: 0 K/uL (ref 0.0–0.1)
Basophils Relative: 1 %
Eosinophils Absolute: 0.3 K/uL (ref 0.0–0.5)
Eosinophils Relative: 4 %
HCT: 40.4 % (ref 39.0–52.0)
Hemoglobin: 13.3 g/dL (ref 13.0–17.0)
Immature Granulocytes: 0 %
Lymphocytes Relative: 16 %
Lymphs Abs: 1 K/uL (ref 0.7–4.0)
MCH: 27.9 pg (ref 26.0–34.0)
MCHC: 32.9 g/dL (ref 30.0–36.0)
MCV: 84.7 fL (ref 80.0–100.0)
Monocytes Absolute: 0.9 K/uL (ref 0.1–1.0)
Monocytes Relative: 14 %
Neutro Abs: 4 K/uL (ref 1.7–7.7)
Neutrophils Relative %: 65 %
Platelet Count: 224 K/uL (ref 150–400)
RBC: 4.77 MIL/uL (ref 4.22–5.81)
RDW: 13.4 % (ref 11.5–15.5)
WBC Count: 6.2 K/uL (ref 4.0–10.5)
nRBC: 0 % (ref 0.0–0.2)

## 2024-03-15 LAB — CMP (CANCER CENTER ONLY)
ALT: 15 U/L (ref 0–44)
AST: 20 U/L (ref 15–41)
Albumin: 4.3 g/dL (ref 3.5–5.0)
Alkaline Phosphatase: 40 U/L (ref 38–126)
Anion gap: 4 — ABNORMAL LOW (ref 5–15)
BUN: 13 mg/dL (ref 8–23)
CO2: 30 mmol/L (ref 22–32)
Calcium: 9.2 mg/dL (ref 8.9–10.3)
Chloride: 106 mmol/L (ref 98–111)
Creatinine: 0.93 mg/dL (ref 0.61–1.24)
GFR, Estimated: >60 mL/min (ref >60)
Glucose, Bld: 112 mg/dL — ABNORMAL HIGH (ref 70–99)
Potassium: 4.9 mmol/L (ref 3.5–5.1)
Sodium: 140 mmol/L (ref 135–145)
Total Bilirubin: 0.9 mg/dL (ref 0.0–1.2)
Total Protein: 6.7 g/dL (ref 6.5–8.1)

## 2024-03-24 NOTE — Progress Notes (Signed)
 HEMATOLOGY/ONCOLOGY CLINIC VISIT NOTE  Date of Service: .03/15/2024   Patient Care Team: Valma Carwin, MD as PCP - General (Internal Medicine) Pietro Redell RAMAN, MD as PCP - Cardiology (Cardiology)  CHIEF COMPLAINTS/PURPOSE OF CONSULTATION:  Follow-up for continued evaluation and management of low-grade non-Hodgkin's lymphoma  HISTORY OF PRESENTING ILLNESS:  Kenneth [name] is a wonderful 73 y.o. male who has been referred to us  by Dr Cleven for evaluation and management of lymphadenopathy. The pt reports that he is doing well overall.    The pt reports that he began to have some back discomfort the day after his CT scan, but has otherwise felt well over the last 6 months. When he stands up, lays down, or walks he does not experience the pain. When he sits for a long period of time or slouches it begins to hurt. He denies any recent changes in his urine or urinary habits. He has previously had kidney stones. Pt also reports overall fatigue, but thinks that this is best explained by his advanced age. He has not had any recent infections. Pt has a cat that he sometimes sleeps in close quarters with. He received his COVID19 vaccine after his scan on 03/24, but prior to his scan on 04/29.   Pt does have some lower leg swelling in both legs which he attributes to weight gain. Pt has had leg swelling in the past during a Gout attack. Pt had PE in 02/20, for which no cause has been found, but there was concern that a blood clot from his lower extremities traveled to his lungs. He has continued using anti-coagulation therapy as prescribed. Pt had a localized Melanoma on his right shoulder that was removed 20 years ago. He was seen at [facility] and received a short series of injections. He last saw his Dermatologist three years ago and there were no worries of recurrence. Pt does have occasional lightheadedness, about once per week, that is improved after eating. About 3-4 years ago he was having episodes  where he felt that he had to focus on breathing because he was not breathing automatically. Pt has had an epididymal cyst for several years that has been unchanging.    Of note prior to the patient's visit today, pt has had CT Abd/Pel ([id number]) completed on 11/07/2019 with results revealing Mild lymphadenopathy throughout the abdomen and pelvis, new since 2011 exam. Suggest correlation for clinical/laboratory evidence of lymphoproliferative disorder, and consider short-term follow-up by CT in 3 months. Stable small benign left adrenal adenoma. Left nephrolithiasis. No evidence of ureteral calculi or hydronephrosis.    Most recent lab results (10/18/2019) of CBC w/diff and CMP is as follows: all values are WNL except for RDW at 40.3, Glucose at 117.   On review of systems, pt reports lower leg swelling, lightheadedness, back pain, fatigue and denies dysuria, hematuria, discolored urine, polyuria, fevers, chills, night sweats, unexpected weight loss, loss of appetite, diarrhea, constipation, abdominal pain, testicular pain/swelling and any other symptoms.    On PMHx the pt reports PE, Palpitations, HTN, Hypercholesterolemia, Ascending Aortic Aneurysm, Gout, Melanoma. On Social Hx the pt reports that he does not smoke cigarettes and does not drink alcohol.    INTERVAL HISTORY:  Kenneth [name] is here for his 64-month follow-up of his low-grade non-Hodgkin's lymphoma. He currently notes no acute new symptoms.  No fevers no chills no night sweats.  No sudden unexpected weight loss.  No new lumps or bumps.  No new shortness of breath  or chest pain.  No abdominal pain or distention.SABRA  Has been following up with orthopedics for osteoarthritis of his knees and lumbar radiculopathy. Had visited the emergency room in November 2024 with palpitations thought to be related to a panic attack.  CTA of the chest done at that time for dizziness showed no evidence of PE.  Incidentally noted to have some upper  abdominal lymphadenopathy and central mesenteric stranding from his low-grade lymphoma. Stable ascending aortic aneurysm at 4.3 cm in diameter.  Labs done today were reviewed with him in details  MEDICAL HISTORY:  Past Medical History:  Diagnosis Date   Ascending aortic aneurysm (HCC) 09/04/2018   Dyspnea 09/03/2018   Elevated troponin 09/03/2018   Gout    Hypercholesteremia    Hypertension    Nephrolithiasis    Palpitations 09/04/2018   PE (pulmonary thromboembolism) (HCC) 09/04/2018   Pulmonary embolism (HCC)     SURGICAL HISTORY: Past Surgical History:  Procedure Laterality Date   No prior surgery      SOCIAL HISTORY: Social History   Socioeconomic History   Marital status: Married    Spouse name: Not on file   Number of children: Not on file   Years of education: Not on file   Highest education level: Not on file  Occupational History   Not on file  Tobacco Use   Smoking status: Never   Smokeless tobacco: Never  Substance and Sexual Activity   Alcohol use: Never   Drug use: Never   Sexual activity: Not on file  Other Topics Concern   Not on file  Social History Narrative   Not on file   Social Drivers of Health   Financial Resource Strain: Not on file  Food Insecurity: Not on file  Transportation Needs: Not on file  Physical Activity: Not on file  Stress: Not on file  Social Connections: Not on file  Intimate Partner Violence: Not on file    FAMILY HISTORY: Family History  Problem Relation Age of Onset   Diabetes Mother    Hypertension Mother    CAD Brother        CABG at age 56    ALLERGIES:  is allergic to hctz [hydrochlorothiazide ].  MEDICATIONS:  Current Outpatient Medications  Medication Sig Dispense Refill   allopurinol  (ZYLOPRIM ) 100 MG tablet Take 100 mg by mouth 2 (two) times daily.   3   apixaban  (ELIQUIS ) 5 MG TABS tablet Take 5 mg by mouth 2 (two) times daily.     calcium  carbonate (TUMS - DOSED IN MG ELEMENTAL CALCIUM ) 500 MG  chewable tablet Chew 1 tablet by mouth at bedtime as needed for indigestion or heartburn.     carvedilol  (COREG ) 12.5 MG tablet TAKE 1 TABLET BY MOUTH TWICE DAILY WITH A MEAL 180 tablet 1   cloNIDine  (CATAPRES ) 0.3 MG tablet Take 0.3 mg by mouth daily.     eplerenone (INSPRA) 25 MG tablet Take 25 mg by mouth daily.     finasteride  (PROSCAR ) 5 MG tablet Take 5 mg by mouth daily.  2   hydrALAZINE  (APRESOLINE ) 50 MG tablet Take 1 tablet (50 mg total) by mouth 2 (two) times daily. 180 tablet 3   losartan  (COZAAR ) 100 MG tablet Take 100 mg by mouth daily.  5   methocarbamol  (ROBAXIN -750) 750 MG tablet Take 1 tablet (750 mg total) by mouth 2 (two) times daily as needed for muscle spasms. 20 tablet 2   methylPREDNISolone  (MEDROL  DOSEPAK) 4 MG TBPK tablet Do not  take nsaids while on this medication 21 tablet 0   oxyCODONE -acetaminophen  (PERCOCET/ROXICET) 5-325 MG tablet Take 1 tablet by mouth every 8 (eight) hours as needed for severe pain. 20 tablet 0   rosuvastatin  (CRESTOR ) 40 MG tablet Take 1 tablet (40 mg total) by mouth daily at 6 PM. 30 tablet 0   traMADol  (ULTRAM ) 50 MG tablet Take 1 tablet (50 mg total) by mouth every 12 (twelve) hours as needed. 30 tablet 2   No current facility-administered medications for this visit.    REVIEW OF SYSTEMS:   .10 Point review of Systems was done is negative except as noted above.  PHYSICAL EXAMINATION:  BP (!) 147/73 Comment: re-check  Pulse 70   Temp (!) 97.5 F (36.4 C)   Resp 20   Wt 252 lb 6.4 oz (114.5 kg)   SpO2 96%   BMI 37.27 kg/m  . GENERAL:alert, in no acute distress and comfortable SKIN: no acute rashes, no significant lesions EYES: conjunctiva are pink and non-injected, sclera anicteric OROPHARYNX: MMM, no exudates, no oropharyngeal erythema or ulceration NECK: supple, no JVD LYMPH:  no palpable lymphadenopathy in the cervical, axillary or inguinal regions LUNGS: clear to auscultation b/l with normal respiratory effort HEART:  regular rate & rhythm ABDOMEN:  normoactive bowel sounds , non tender, not distended.  No palpable hepatosplenomegaly Extremity: no pedal edema PSYCH: alert & oriented x 3 with fluent speech NEURO: no focal motor/sensory deficits   LABORATORY DATA:  I have reviewed the data as listed  .    Latest Ref Rng & Units 03/15/2024   11:23 AM 06/08/2023    3:37 PM 02/23/2023   10:43 AM  CBC  WBC 4.0 - 10.5 K/uL 6.2  7.5  5.4   Hemoglobin 13.0 - 17.0 g/dL 86.6  86.4  86.6   Hematocrit 39.0 - 52.0 % 40.4  42.6  39.6   Platelets 150 - 400 K/uL 224  246  243     .    Latest Ref Rng & Units 03/15/2024   11:23 AM 06/08/2023    3:37 PM 02/23/2023   10:43 AM  CMP  Glucose 70 - 99 mg/dL 887  861  775   BUN 8 - 23 mg/dL 13  8  9    Creatinine 0.61 - 1.24 mg/dL 9.06  9.10  9.08   Sodium 135 - 145 mmol/L 140  137  137   Potassium 3.5 - 5.1 mmol/L 4.9  3.6  3.9   Chloride 98 - 111 mmol/L 106  105  107   CO2 22 - 32 mmol/L 30  21  21    Calcium  8.9 - 10.3 mg/dL 9.2  9.7  8.4   Total Protein 6.5 - 8.1 g/dL 6.7   6.1   Total Bilirubin 0.0 - 1.2 mg/dL 0.9   0.8   Alkaline Phos 38 - 126 U/L 40   39   AST 15 - 41 U/L 20   16   ALT 0 - 44 U/L 15   21    . Lab Results  Component Value Date   LDH 166 03/15/2024    12/16/19 of Surgical Pathology [phone])  RADIOGRAPHIC STUDIES: I have personally reviewed the radiological images as listed and agreed with the findings in the report. No results found.  CTA C/A/P 10/02/2019: IMPRESSION: 1. Stable aneurysmal dilatation of the ascending thoracic aorta. Recommend annual imaging followup by CTA or MRA. This recommendation follows 2010 ACCF/AHA/AATS/ACR/ASA/SCA/SCAI/SIR/STS/SVM Guidelines for the Diagnosis and Management of Patients with  Thoracic Aortic Disease. Circulation. 2010; 121: Z733-z630. Aortic aneurysm NOS (ICD10-I71.9) 2. Interval development of mediastinal and upper abdominal lymphadenopathy. Please correlate with any history of  lymphoma or leukemia. Dedicated CT of the abdomen and pelvis may be useful to evaluate the extent of the abdominal adenopathy. 3. No evidence of pulmonary emboli on this exam. Resolution of the segmental lower lobe emboli seen previously.   These results will be called to the ordering clinician or representative by the Radiologist Assistant, and communication documented in the PACS or [employer].     Electronically Signed   By: Ozell Daring M.D.   On: 10/02/2019 17:00    ASSESSMENT & PLAN:   73 yo with   1) Low grade Non Hodgkins lymphoma Presented with generalized lymphadenopathy in the mediastinum and throughout abdomen and pelvis-  Bx ssuggesting low grade NHL -- likely Marginal zone lymphoma vs FL  PLAN: - Patient has no new clinical symptoms suggestive of lymphoma progression at this time.  No B symptoms. Lab results from 03/15/2024 were discussed in detail with the patient CBC is within normal limits with normal hemoglobin of 13.1 normal WBC count and platelets CMP is stable LDH is within normal limits at 166. Patient has normal lab or clinical evidence of significant symptomatic lymphoma progression at this time to warrant repeat scans or treatment considerations. Patient has been educated on symptoms to monitor for and inform us  that might indicate an earlier reevaluation of his disease status. We shall plan to see him back in 12 months with labs and a repeat HPI unless new symptoms arise in the interim. Continue follow-up with PCP for age-appropriate cancer screening and vaccinations and other chronic medical cares.  FOLLOW-UP: Return to clinic with Dr. Onesimo with labs in 12 months  The total time spent in the appointment was 20 minutes*.  All of the patient's questions were answered with apparent satisfaction. The patient knows to call the clinic with any problems, questions or concerns.   Emaline Onesimo MD MS AAHIVMS [facility] Hematology/Oncology Physician [facility]  .*Total Encounter Time as defined by the Centers for Medicare and Medicaid Services includes, in addition to the face-to-face time of a patient visit (documented in the note above) non-face-to-face time: obtaining and reviewing outside history, ordering and reviewing medications, tests or procedures, care coordination (communications with other health care professionals or caregivers) and documentation in the medical record.

## 2025-03-18 ENCOUNTER — Other Ambulatory Visit

## 2025-03-18 ENCOUNTER — Ambulatory Visit: Admitting: Hematology
